# Patient Record
Sex: Female | Born: 1935 | Race: White | Hispanic: No | Smoking: Never smoker
Health system: Southern US, Community
[De-identification: ages and names within clinical notes are randomized; demographics above are authoritative.]

## PROBLEM LIST (undated history)

## (undated) DIAGNOSIS — E039 Hypothyroidism, unspecified: Secondary | ICD-10-CM

## (undated) DIAGNOSIS — M1990 Unspecified osteoarthritis, unspecified site: Secondary | ICD-10-CM

## (undated) DIAGNOSIS — F0390 Unspecified dementia without behavioral disturbance: Secondary | ICD-10-CM

## (undated) DIAGNOSIS — D649 Anemia, unspecified: Secondary | ICD-10-CM

## (undated) DIAGNOSIS — N390 Urinary tract infection, site not specified: Secondary | ICD-10-CM

## (undated) DIAGNOSIS — E785 Hyperlipidemia, unspecified: Secondary | ICD-10-CM

## (undated) DIAGNOSIS — I10 Essential (primary) hypertension: Secondary | ICD-10-CM

## (undated) DIAGNOSIS — N189 Chronic kidney disease, unspecified: Secondary | ICD-10-CM

## (undated) HISTORY — PX: FRACTURE SURGERY: SHX138

## (undated) HISTORY — PX: ROTATOR CUFF REPAIR: SHX139

## (undated) HISTORY — PX: LITHOTRIPSY: SUR834

## (undated) HISTORY — PX: CATARACT EXTRACTION: SUR2

## (undated) HISTORY — PX: NASAL SINUS SURGERY: SHX719

---

## 2012-01-23 ENCOUNTER — Emergency Department (HOSPITAL_COMMUNITY): Payer: Medicare Other

## 2012-01-23 ENCOUNTER — Ambulatory Visit (HOSPITAL_COMMUNITY): Payer: Medicare Other

## 2012-01-23 ENCOUNTER — Encounter (HOSPITAL_COMMUNITY): Payer: Self-pay | Admitting: *Deleted

## 2012-01-23 ENCOUNTER — Encounter (HOSPITAL_COMMUNITY): Payer: Self-pay | Admitting: Anesthesiology

## 2012-01-23 ENCOUNTER — Inpatient Hospital Stay (HOSPITAL_COMMUNITY)
Admission: EM | Admit: 2012-01-23 | Discharge: 2012-01-27 | DRG: 482 | Disposition: A | Discharge status: Skilled Nursing Facility | Payer: Medicare Other | Attending: Specialist | Admitting: Specialist

## 2012-01-23 ENCOUNTER — Encounter (HOSPITAL_COMMUNITY)
Admission: EM | Disposition: A | Discharge status: Skilled Nursing Facility | Payer: Self-pay | Source: Home / Self Care | Attending: Specialist

## 2012-01-23 ENCOUNTER — Ambulatory Visit (HOSPITAL_COMMUNITY): Payer: Medicare Other | Admitting: Anesthesiology

## 2012-01-23 DIAGNOSIS — E032 Hypothyroidism due to medicaments and other exogenous substances: Secondary | ICD-10-CM

## 2012-01-23 DIAGNOSIS — Y92009 Unspecified place in unspecified non-institutional (private) residence as the place of occurrence of the external cause: Secondary | ICD-10-CM

## 2012-01-23 DIAGNOSIS — F0390 Unspecified dementia without behavioral disturbance: Secondary | ICD-10-CM

## 2012-01-23 DIAGNOSIS — I10 Essential (primary) hypertension: Secondary | ICD-10-CM | POA: Diagnosis present

## 2012-01-23 DIAGNOSIS — F068 Other specified mental disorders due to known physiological condition: Secondary | ICD-10-CM | POA: Diagnosis present

## 2012-01-23 DIAGNOSIS — S7292XA Unspecified fracture of left femur, initial encounter for closed fracture: Secondary | ICD-10-CM

## 2012-01-23 DIAGNOSIS — S7222XA Displaced subtrochanteric fracture of left femur, initial encounter for closed fracture: Secondary | ICD-10-CM | POA: Diagnosis present

## 2012-01-23 DIAGNOSIS — W19XXXA Unspecified fall, initial encounter: Secondary | ICD-10-CM | POA: Diagnosis present

## 2012-01-23 DIAGNOSIS — E039 Hypothyroidism, unspecified: Secondary | ICD-10-CM | POA: Diagnosis present

## 2012-01-23 DIAGNOSIS — S72309A Unspecified fracture of shaft of unspecified femur, initial encounter for closed fracture: Secondary | ICD-10-CM

## 2012-01-23 DIAGNOSIS — E785 Hyperlipidemia, unspecified: Secondary | ICD-10-CM | POA: Diagnosis present

## 2012-01-23 DIAGNOSIS — S7223XA Displaced subtrochanteric fracture of unspecified femur, initial encounter for closed fracture: Principal | ICD-10-CM | POA: Diagnosis present

## 2012-01-23 HISTORY — DX: Chronic kidney disease, unspecified: N18.9

## 2012-01-23 HISTORY — DX: Unspecified osteoarthritis, unspecified site: M19.90

## 2012-01-23 HISTORY — PX: FEMUR IM NAIL: SHX1597

## 2012-01-23 HISTORY — DX: Anemia, unspecified: D64.9

## 2012-01-23 HISTORY — DX: Unspecified dementia, unspecified severity, without behavioral disturbance, psychotic disturbance, mood disturbance, and anxiety: F03.90

## 2012-01-23 HISTORY — DX: Hyperlipidemia, unspecified: E78.5

## 2012-01-23 HISTORY — DX: Essential (primary) hypertension: I10

## 2012-01-23 HISTORY — DX: Hypothyroidism, unspecified: E03.9

## 2012-01-23 LAB — COMPREHENSIVE METABOLIC PANEL
ALT: 17 U/L (ref 0–35)
AST: 27 U/L (ref 0–37)
Albumin: 3.7 g/dL (ref 3.5–5.2)
CO2: 23 mEq/L (ref 19–32)
Chloride: 99 mEq/L (ref 96–112)
Creatinine, Ser: 0.71 mg/dL (ref 0.50–1.10)
Potassium: 3.3 mEq/L — ABNORMAL LOW (ref 3.5–5.1)
Sodium: 136 mEq/L (ref 135–145)
Total Bilirubin: 0.9 mg/dL (ref 0.3–1.2)

## 2012-01-23 LAB — ABO/RH: ABO/RH(D): O POS

## 2012-01-23 LAB — CBC
MCV: 89.7 fL (ref 78.0–100.0)
Platelets: 250 10*3/uL (ref 150–400)
RBC: 4.95 MIL/uL (ref 3.87–5.11)
RDW: 12 % (ref 11.5–15.5)
WBC: 11.5 10*3/uL — ABNORMAL HIGH (ref 4.0–10.5)

## 2012-01-23 LAB — PROTIME-INR: INR: 1 (ref 0.00–1.49)

## 2012-01-23 LAB — SURGICAL PCR SCREEN: Staphylococcus aureus: NEGATIVE

## 2012-01-23 LAB — CK: Total CK: 597 U/L — ABNORMAL HIGH (ref 7–177)

## 2012-01-23 LAB — TYPE AND SCREEN
ABO/RH(D): O POS
Antibody Screen: NEGATIVE

## 2012-01-23 SURGERY — INSERTION, INTRAMEDULLARY ROD, FEMUR
Anesthesia: General | Site: Leg Upper | Laterality: Left | Wound class: Clean

## 2012-01-23 MED ORDER — ONDANSETRON HCL 4 MG PO TABS: 4.0000 mg | ORAL_TABLET | Freq: Four times a day (QID) | ORAL | Status: DC | PRN

## 2012-01-23 MED ORDER — PHENOL 1.4 % MT LIQD
1.0000 | OROMUCOSAL | Status: DC | PRN
  Filled 2012-01-23: qty 177

## 2012-01-23 MED ORDER — LACTATED RINGERS IV SOLN: INTRAVENOUS | Status: DC

## 2012-01-23 MED ORDER — METOCLOPRAMIDE HCL 5 MG/ML IJ SOLN: 5.0000 mg | Freq: Three times a day (TID) | INTRAMUSCULAR | Status: DC | PRN

## 2012-01-23 MED ORDER — FENTANYL CITRATE 0.05 MG/ML IJ SOLN: 25.0000 ug | INTRAMUSCULAR | Status: DC | PRN

## 2012-01-23 MED ORDER — PROMETHAZINE HCL 25 MG/ML IJ SOLN: 6.2500 mg | INTRAMUSCULAR | Status: DC | PRN

## 2012-01-23 MED ORDER — METHOCARBAMOL 100 MG/ML IJ SOLN
500.0000 mg | Freq: Four times a day (QID) | INTRAVENOUS | Status: DC | PRN
  Filled 2012-01-23: qty 5

## 2012-01-23 MED ORDER — BISACODYL 10 MG RE SUPP: 10.0000 mg | Freq: Every day | RECTAL | Status: DC | PRN

## 2012-01-23 MED ORDER — CEFAZOLIN SODIUM 1-5 GM-% IV SOLN
1.0000 g | INTRAVENOUS | Status: AC
  Administered 2012-01-23: 1 g via INTRAVENOUS

## 2012-01-23 MED ORDER — LEVOTHYROXINE SODIUM 125 MCG PO TABS
125.0000 ug | ORAL_TABLET | Freq: Every day | ORAL | Status: DC
  Administered 2012-01-24 – 2012-01-27 (×4): 125 ug via ORAL
  Filled 2012-01-23 (×4): qty 1

## 2012-01-23 MED ORDER — VERAPAMIL HCL ER 240 MG PO TBCR
240.0000 mg | EXTENDED_RELEASE_TABLET | Freq: Every day | ORAL | Status: DC
  Administered 2012-01-24 – 2012-01-27 (×4): 240 mg via ORAL
  Filled 2012-01-23 (×4): qty 1

## 2012-01-23 MED ORDER — PROPOFOL 10 MG/ML IV EMUL
INTRAVENOUS | Status: DC | PRN
  Administered 2012-01-23: 120 mg via INTRAVENOUS

## 2012-01-23 MED ORDER — HYDROCHLOROTHIAZIDE 12.5 MG PO CAPS
12.5000 mg | ORAL_CAPSULE | Freq: Every day | ORAL | Status: DC
  Administered 2012-01-24: 12.5 mg via ORAL
  Filled 2012-01-23: qty 1

## 2012-01-23 MED ORDER — POTASSIUM CHLORIDE IN NACL 20-0.9 MEQ/L-% IV SOLN
INTRAVENOUS | Status: AC
  Filled 2012-01-23: qty 1000

## 2012-01-23 MED ORDER — LIDOCAINE HCL (CARDIAC) 20 MG/ML IV SOLN
INTRAVENOUS | Status: DC | PRN
  Administered 2012-01-23: 70 mg via INTRAVENOUS

## 2012-01-23 MED ORDER — POTASSIUM CHLORIDE IN NACL 20-0.9 MEQ/L-% IV SOLN
INTRAVENOUS | Status: DC
  Administered 2012-01-23 – 2012-01-25 (×3): via INTRAVENOUS
  Filled 2012-01-23 (×5): qty 1000

## 2012-01-23 MED ORDER — FENTANYL CITRATE 0.05 MG/ML IJ SOLN
INTRAMUSCULAR | Status: DC | PRN
  Administered 2012-01-23: 100 ug via INTRAVENOUS

## 2012-01-23 MED ORDER — ACETAMINOPHEN 10 MG/ML IV SOLN
INTRAVENOUS | Status: DC | PRN
  Administered 2012-01-23: 1000 mg via INTRAVENOUS

## 2012-01-23 MED ORDER — SODIUM CHLORIDE 0.9 % IV SOLN
Freq: Once | INTRAVENOUS | Status: AC
  Administered 2012-01-23: 15:00:00 via INTRAVENOUS

## 2012-01-23 MED ORDER — CEFAZOLIN SODIUM 1-5 GM-% IV SOLN
1.0000 g | Freq: Four times a day (QID) | INTRAVENOUS | Status: AC
  Administered 2012-01-24 (×3): 1 g via INTRAVENOUS
  Filled 2012-01-23 (×3): qty 50

## 2012-01-23 MED ORDER — EPHEDRINE SULFATE 50 MG/ML IJ SOLN
INTRAMUSCULAR | Status: DC | PRN
  Administered 2012-01-23: 10 mg via INTRAVENOUS

## 2012-01-23 MED ORDER — POLYETHYLENE GLYCOL 3350 17 G PO PACK
17.0000 g | PACK | Freq: Every day | ORAL | Status: DC | PRN
  Administered 2012-01-27: 17 g via ORAL

## 2012-01-23 MED ORDER — CEFAZOLIN SODIUM 1-5 GM-% IV SOLN
INTRAVENOUS | Status: AC
  Filled 2012-01-23: qty 50

## 2012-01-23 MED ORDER — MORPHINE SULFATE 4 MG/ML IJ SOLN
2.0000 mg | Freq: Once | INTRAMUSCULAR | Status: AC
  Administered 2012-01-23: 2 mg via INTRAVENOUS
  Filled 2012-01-23: qty 1

## 2012-01-23 MED ORDER — FENTANYL CITRATE 0.05 MG/ML IJ SOLN
INTRAMUSCULAR | Status: AC
  Filled 2012-01-23: qty 2

## 2012-01-23 MED ORDER — FENTANYL CITRATE 0.05 MG/ML IJ SOLN
100.0000 ug | Freq: Once | INTRAMUSCULAR | Status: AC
  Administered 2012-01-23: 100 ug via INTRAVENOUS

## 2012-01-23 MED ORDER — HYDROCODONE-ACETAMINOPHEN 5-325 MG PO TABS: 1.0000 | ORAL_TABLET | ORAL | Status: DC | PRN

## 2012-01-23 MED ORDER — 0.9 % SODIUM CHLORIDE (POUR BTL) OPTIME
TOPICAL | Status: DC | PRN
  Administered 2012-01-23: 1000 mL

## 2012-01-23 MED ORDER — STERILE WATER FOR IRRIGATION IR SOLN
Status: DC | PRN
  Administered 2012-01-23: 1500 mL

## 2012-01-23 MED ORDER — LACTATED RINGERS IV SOLN
INTRAVENOUS | Status: DC | PRN
  Administered 2012-01-23: 19:00:00 via INTRAVENOUS

## 2012-01-23 MED ORDER — METHOCARBAMOL 500 MG PO TABS
500.0000 mg | ORAL_TABLET | Freq: Four times a day (QID) | ORAL | Status: DC | PRN
  Administered 2012-01-25 – 2012-01-27 (×3): 500 mg via ORAL
  Filled 2012-01-23 (×3): qty 1

## 2012-01-23 MED ORDER — ONDANSETRON HCL 4 MG/2ML IJ SOLN
INTRAMUSCULAR | Status: DC | PRN
  Administered 2012-01-23: 4 mg via INTRAVENOUS

## 2012-01-23 MED ORDER — MENTHOL 3 MG MT LOZG
1.0000 | LOZENGE | OROMUCOSAL | Status: DC | PRN
  Filled 2012-01-23: qty 9

## 2012-01-23 MED ORDER — PHENYLEPHRINE HCL 10 MG/ML IJ SOLN
INTRAMUSCULAR | Status: DC | PRN
  Administered 2012-01-23 (×2): 40 ug via INTRAVENOUS

## 2012-01-23 MED ORDER — CYANOCOBALAMIN 500 MCG PO TABS
500.0000 ug | ORAL_TABLET | Freq: Every day | ORAL | Status: DC
  Administered 2012-01-24 – 2012-01-27 (×4): 500 ug via ORAL
  Filled 2012-01-23 (×4): qty 1

## 2012-01-23 MED ORDER — DOCUSATE SODIUM 100 MG PO CAPS
100.0000 mg | ORAL_CAPSULE | Freq: Two times a day (BID) | ORAL | Status: DC
  Administered 2012-01-24 – 2012-01-27 (×7): 100 mg via ORAL

## 2012-01-23 MED ORDER — MUPIROCIN 2 % EX OINT
TOPICAL_OINTMENT | CUTANEOUS | Status: AC
  Filled 2012-01-23: qty 22

## 2012-01-23 MED ORDER — HYDROCODONE-ACETAMINOPHEN 5-325 MG PO TABS
1.0000 | ORAL_TABLET | ORAL | Status: DC | PRN
  Administered 2012-01-24: 2 via ORAL
  Administered 2012-01-27: 1 via ORAL
  Filled 2012-01-23: qty 1
  Filled 2012-01-23: qty 2
  Filled 2012-01-23: qty 1

## 2012-01-23 MED ORDER — ACETAMINOPHEN 650 MG RE SUPP: 650.0000 mg | Freq: Four times a day (QID) | RECTAL | Status: DC | PRN

## 2012-01-23 MED ORDER — SUCCINYLCHOLINE CHLORIDE 20 MG/ML IJ SOLN
INTRAMUSCULAR | Status: DC | PRN
  Administered 2012-01-23: 100 mg via INTRAVENOUS

## 2012-01-23 MED ORDER — PHENYLEPHRINE HCL 10 MG/ML IJ SOLN
10.0000 mg | INTRAVENOUS | Status: DC | PRN
  Administered 2012-01-23: 10 ug/min via INTRAVENOUS

## 2012-01-23 MED ORDER — ONDANSETRON HCL 4 MG/2ML IJ SOLN: 4.0000 mg | Freq: Four times a day (QID) | INTRAMUSCULAR | Status: DC | PRN

## 2012-01-23 MED ORDER — ACETAMINOPHEN 325 MG PO TABS
650.0000 mg | ORAL_TABLET | Freq: Four times a day (QID) | ORAL | Status: DC | PRN
  Administered 2012-01-24 – 2012-01-27 (×5): 650 mg via ORAL
  Filled 2012-01-23 (×6): qty 2

## 2012-01-23 MED ORDER — ACETAMINOPHEN 10 MG/ML IV SOLN
INTRAVENOUS | Status: AC
  Filled 2012-01-23: qty 100

## 2012-01-23 MED ORDER — MORPHINE SULFATE 2 MG/ML IJ SOLN: 0.5000 mg | INTRAMUSCULAR | Status: DC | PRN

## 2012-01-23 MED ORDER — METOCLOPRAMIDE HCL 10 MG PO TABS: 5.0000 mg | ORAL_TABLET | Freq: Three times a day (TID) | ORAL | Status: DC | PRN

## 2012-01-23 SURGICAL SUPPLY — 44 items
BAG ZIPLOCK 12X15 (MISCELLANEOUS) ×2 IMPLANT
BANDAGE GAUZE ELAST BULKY 4 IN (GAUZE/BANDAGES/DRESSINGS) ×2 IMPLANT
BIT DRILL 3.8X6 NS (BIT) ×2 IMPLANT
BIT DRILL 5.3 (BIT) ×2 IMPLANT
BNDG COHESIVE 6X5 TAN STRL LF (GAUZE/BANDAGES/DRESSINGS) ×2 IMPLANT
CLOTH BEACON ORANGE TIMEOUT ST (SAFETY) ×2 IMPLANT
COVER MAYO STAND STRL (DRAPES) ×2 IMPLANT
DRAPE C-ARM 42X72 X-RAY (DRAPES) IMPLANT
DRAPE STERI IOBAN 125X83 (DRAPES) ×2 IMPLANT
DRSG MEPILEX BORDER 4X4 (GAUZE/BANDAGES/DRESSINGS) ×2 IMPLANT
DRSG MEPILEX BORDER 4X8 (GAUZE/BANDAGES/DRESSINGS) ×2 IMPLANT
DRSG PAD ABDOMINAL 8X10 ST (GAUZE/BANDAGES/DRESSINGS) ×6 IMPLANT
DURAPREP 26ML APPLICATOR (WOUND CARE) ×2 IMPLANT
ELECT REM PT RETURN 9FT ADLT (ELECTROSURGICAL) ×2
ELECTRODE REM PT RTRN 9FT ADLT (ELECTROSURGICAL) ×1 IMPLANT
GLOVE BIO SURGEON STRL SZ 6.5 (GLOVE) ×2 IMPLANT
GLOVE BIOGEL PI IND STRL 8 (GLOVE) ×1 IMPLANT
GLOVE BIOGEL PI INDICATOR 8 (GLOVE) ×1
GLOVE ECLIPSE 6.5 STRL STRAW (GLOVE) IMPLANT
GLOVE INDICATOR 6.5 STRL GRN (GLOVE) ×2 IMPLANT
GLOVE SURG SS PI 8.0 STRL IVOR (GLOVE) IMPLANT
GOWN PREVENTION PLUS LG XLONG (DISPOSABLE) IMPLANT
GOWN PREVENTION PLUS XLARGE (GOWN DISPOSABLE) IMPLANT
GOWN STRL REIN XL XLG (GOWN DISPOSABLE) ×4 IMPLANT
GUIDEPIN 3.2X17.5 THRD DISP (PIN) ×2 IMPLANT
GUIDEWIRE BALL NOSE 100CM (WIRE) ×2 IMPLANT
KIT BASIN OR (CUSTOM PROCEDURE TRAY) ×2 IMPLANT
MANIFOLD NEPTUNE II (INSTRUMENTS) ×2 IMPLANT
NAIL FEMORAL 11X36CM (Nail) ×2 IMPLANT
PACK GENERAL/GYN (CUSTOM PROCEDURE TRAY) ×2 IMPLANT
PAD CAST 4YDX4 CTTN HI CHSV (CAST SUPPLIES) ×1 IMPLANT
PADDING CAST COTTON 4X4 STRL (CAST SUPPLIES) ×1
SCREW ACE CORTICAL (Screw) ×1 IMPLANT
SCREW ACE CORTICAL 6.5X70MML (Screw) IMPLANT
SCREW ACECAP 40MM (Screw) ×2 IMPLANT
SCREW BN FT 80X6.5XST DRV (Screw) ×1 IMPLANT
SCREWDRIVER HEX TIP 3.5MM (MISCELLANEOUS) ×2 IMPLANT
SPONGE GAUZE 4X4 12PLY (GAUZE/BANDAGES/DRESSINGS) ×2 IMPLANT
STAPLER VISISTAT (STAPLE) ×2 IMPLANT
SUT VIC AB 0 CT1 27 (SUTURE) ×2
SUT VIC AB 0 CT1 27XBRD ANTBC (SUTURE) ×2 IMPLANT
SUT VIC AB 2-0 CT1 27 (SUTURE) ×1
SUT VIC AB 2-0 CT1 TAPERPNT 27 (SUTURE) ×1 IMPLANT
TRAY FOLEY CATH 14FRSI W/METER (CATHETERS) ×2 IMPLANT

## 2012-01-23 NOTE — ED Provider Notes (Signed)
History     CSN: [id number]  Arrival date & time 01/23/12  1403   None     Chief Complaint  Patient presents with  . Fall    HPI The patient presents following a fall.  The patient has dementia and is incapable of providing any information regarding the history of present illness.  Per report the patient was found on the floor next to her bed with a deformity on the left lower extremity.  She is typically ambulatory.  Per the patient's grand daughter, the patient has no other notable medical problems.  However, the patient's full medical history is difficult to ascertain.  Level V Caveat - Dementia  No past medical history on file.  No past surgical history on file.  No family history on file.  History  Substance Use Topics  . Smoking status: Not on file  . Smokeless tobacco: Not on file  . Alcohol Use: Not on file    OB History    No data available      Review of Systems  Unable to perform ROS: Dementia    Allergies  Review of patient's allergies indicates not on file.  Home Medications  No current outpatient prescriptions on file.  BP 150/81  Pulse 93  Temp(Src) 97.4 F (36.3 C) (Oral)  Resp 22  SpO2 97%  Physical Exam  Nursing note and vitals reviewed. Constitutional: Vital signs are normal. She appears listless. She appears cachectic. Cervical collar and backboard in place.    HENT:  Head: Head is without raccoon's eyes, without Battle's sign and without right periorbital erythema. Hair is normal.  Mouth/Throat: Mucous membranes are normal.  Eyes: Conjunctivae are normal.  Neck: Neck supple. No tracheal tenderness and no spinous process tenderness present. Erythema present. Normal range of motion present. No thyromegaly present.       Patient's C-Collar removed.  She is moving the neck freely and has no appreciable deformity, nor any ttp.  Cardiovascular: Regular rhythm and intact distal pulses.   Murmur heard. Pulmonary/Chest: Stridor present. No  respiratory distress. She has no wheezes.  Abdominal: She exhibits no distension.  Genitourinary: Vagina normal.  Musculoskeletal:       The pelvis is stable, though just inferior to the pelvis there is a notable deformity of the proximal thigh.  The patient's knee is aligned, and she moves it minimally, spontaneously.  The ankle has appropriate pulses and the patient moves it minimally as well.  Neurological: She appears listless.       The patient does not interact with neurologic exam.  She tracks visually, spontaneously but not consistently with command.  Moves all extremities spontaneously.  Skin: Skin is intact. No abrasion and no rash noted.       ED Course  Procedures (including critical care time)   Labs Reviewed  CBC  COMPREHENSIVE METABOLIC PANEL  PROTIME-INR  CK  URINALYSIS, ROUTINE W REFLEX MICROSCOPIC   No results found.   No diagnosis found.    MDM  This elderly female with dementia presents following a fall with new left proximal thigh deformity consistent with femur fracture.  This was demonstrated on x-ray.  The remainder of her films were largely reassuring.  I discussed the case with Dr. Shelle Iron, and he requested that the patient be transferred to [facility] due to OR availability.    Gerhard Munch, MD 01/23/12 [phone]

## 2012-01-23 NOTE — Transfer of Care (Signed)
Immediate Anesthesia Transfer of Care Note  Patient: Anne Gonzales  Procedure(s) Performed: Procedure(s) (LRB): INTRAMEDULLARY (IM) NAIL FEMORAL (Left)  Patient Location: PACU  Anesthesia Type: General  Level of Consciousness: sedated and patient cooperative  Airway & Oxygen Therapy: Patient Spontanous Breathing and Patient connected to face mask oxygen  Post-op Assessment: Report given to PACU RN and Post -op Vital signs reviewed and stable  Post vital signs: Reviewed and stable  Complications: No apparent anesthesia complications

## 2012-01-23 NOTE — Anesthesia Preprocedure Evaluation (Addendum)
Anesthesia Evaluation  Patient identified by MRN, date of birth, ID band Patient awake and Patient confused  General Assessment Comment:Granddaughter providing medical history.   Reviewed: Allergy & Precautions, H&P , NPO status , Patient's Chart, lab work & pertinent test results  History of Anesthesia Complications Negative for: history of anesthetic complications  Airway Mallampati: II TM Distance: >3 FB Neck ROM: Full    Dental  (+) Edentulous Upper and Edentulous Lower   Pulmonary neg pulmonary ROS,  breath sounds clear to auscultation  Pulmonary exam normal       Cardiovascular hypertension, Pt. on medications negative cardio ROS  Rhythm:Regular Rate:Normal     Neuro/Psych Dementia negative neurological ROS  negative psych ROS   GI/Hepatic negative GI ROS, Neg liver ROS,   Endo/Other  negative endocrine ROSHypothyroidism   Renal/GU Renal InsufficiencyRenal diseasenegative Renal ROS  negative genitourinary   Musculoskeletal negative musculoskeletal ROS (+)   Abdominal   Peds  Hematology negative hematology ROS (+)   Anesthesia Other Findings   Reproductive/Obstetrics negative OB ROS                           Anesthesia Physical Anesthesia Plan  ASA: III and Emergent  Anesthesia Plan: General   Post-op Pain Management:    Induction: Intravenous  Airway Management Planned: Oral ETT  Additional Equipment:   Intra-op Plan:   Post-operative Plan: Extubation in OR  Informed Consent: I have reviewed the patients History and Physical, chart, labs and discussed the procedure including the risks, benefits and alternatives for the proposed anesthesia with the patient or authorized representative who has indicated his/her understanding and acceptance.   Dental advisory given  Plan Discussed with: CRNA  Anesthesia Plan Comments:         Anesthesia Quick Evaluation

## 2012-01-23 NOTE — ED Notes (Signed)
Patient was rolled off long spineboard while maintaining C-Spine Dr. Jeraldine Loots at bedside. Firm support to left arm during movement. Good pedal pulse post board removal.

## 2012-01-23 NOTE — Brief Op Note (Signed)
01/23/2012  9:28 PM  PATIENT:  Anne Gonzales  78 y.o. female  PRE-OPERATIVE DIAGNOSIS:  fractured left hip  POST-OPERATIVE DIAGNOSIS:  fractured left hip  PROCEDURE:  Procedure(s) (LRB): INTRAMEDULLARY (IM) NAIL FEMORAL (Left)  SURGEON:  Surgeon(s) and Role:    * Javier Docker, MD - Primary  PHYSICIAN ASSISTANT:   ASSISTANTS: strader   ANESTHESIA:   general  EBL:  Total I/O In: -  Out: 200 [Urine:200]  BLOOD ADMINISTERED:none  DRAINS: none   LOCAL MEDICATIONS USED:  NONE  SPECIMEN:  No Specimen  DISPOSITION OF SPECIMEN:  N/A  COUNTS:  YES  TOURNIQUET:  * No tourniquets in log *  DICTATION: .Other Dictation: Dictation Number [id number]  PLAN OF CARE: Admit to inpatient   PATIENT DISPOSITION:  PACU - hemodynamically stable.   Delay start of Pharmacological VTE agent (>24hrs) due to surgical blood loss or risk of bleeding: no

## 2012-01-23 NOTE — Preoperative (Signed)
Beta Blockers   Reason not to administer Beta Blockers:Not Applicable 

## 2012-01-23 NOTE — Anesthesia Postprocedure Evaluation (Signed)
Anesthesia Post Note  Patient: Anne Gonzales  Procedure(s) Performed: Procedure(s) (LRB): INTRAMEDULLARY (IM) NAIL FEMORAL (Left)  Anesthesia type: General  Patient location: PACU  Post pain: Pain level controlled  Post assessment: Post-op Vital signs reviewed  Last Vitals:  Filed Vitals:   01/23/12 2230  BP:   Pulse:   Temp: 36.4 C  Resp:     Post vital signs: Reviewed  Level of consciousness: sedated  Complications: No apparent anesthesia complications

## 2012-01-23 NOTE — H&P (Signed)
Chief Complaint:  left hip pain.  Subjective: Patient is admitted for left IM nail of the femur  Patient is a 78 y.o. female who sustained a fall today at home.  Her caregiver was found her laying beside the shower.  She was brought via EMS to [facility] ED where X-rays showed displaced left femur fracture.  Per ER MD the patient was stable and did not complain of any other injuries.  After review of X-rays Dr Shelle Iron felt the patient needed IM nail of the femur for stabilization.  No OR time was available at [facility] so the patient was transferred via carelink to [facility] for surgical intervention.  We did consult [facility] for medical clearance and management of postoperative issues. Risks and benefits of the surgery were discussed with the grand daughter.  Allergies: No Known Allergies   Medications: Prescriptions prior to admission  Medication Sig Dispense Refill  . aspirin 325 MG EC tablet Take 325 mg by mouth daily.      . hydrochlorothiazide (MICROZIDE) 12.5 MG capsule Take 12.5 mg by mouth daily.      Marland Kitchen levothyroxine (SYNTHROID, LEVOTHROID) 125 MCG tablet Take 125 mcg by mouth daily.      . verapamil (CALAN-SR) 240 MG CR tablet Take 240 mg by mouth daily.      . vitamin B-12 (CYANOCOBALAMIN) 500 MCG tablet Take 500 mcg by mouth daily.      . Vitamin D, Ergocalciferol, (DRISDOL) 50000 UNITS CAPS Take 50,000 Units by mouth every 7 (seven) days.        Past Medical History: Past Medical History  Diagnosis Date  . Dementia   Hypertension Hypercholesteremia Hypothyroidism   Past Surgical History: No past surgical history on file.   Family History: No family history on file.  Social History: History  Substance Use Topics  . Smoking status: Not on file  . Smokeless tobacco: Not on file  . Alcohol Use:      Review of Systems Review of systems not obtained due to patient factors.  Physical Exam:  BP 150/81  Pulse 93  Temp(Src) 97.4 F (36.3 C) (Oral)  Resp 22  SpO2 97        General appearance: no distress, slowed mentation and confused as to time and place, hard of hearing Lungs: clear to auscultation bilaterally Heart: regular rate and rhythm, S1, S2 normal, no murmur, click, rub or gallop Extremities: shortened and externally rotated left leg, sensation is intact, good capillary refill, compartments are soft  Assessment/Plan: Left femur fracture The patient is being admitted to [facility] to undergo a left femur IM nailing.  Surgery will be performed by Dr. Jene Every.  Risks and benefits have been discussed with the patient and they elect to proceed wth the procedure.

## 2012-01-23 NOTE — ED Notes (Addendum)
Pt was found on floor beside her bedside commode at nursing home. Unwitnessed fall. Correction: pt lives at home, not at nursing facility. Pt has caregiver who visits her in the home.

## 2012-01-23 NOTE — ED Notes (Signed)
Patient arrived to ed via [facility] states caretaker found her lying beside. Her bed unsure how long patient had been lying there. EMS states [facility] was bedside of her bed as if she was trying to get on the [facility]. Upon arrival to ed alert non- verbal per ems normal for her baseline. Angulation to left femur, positive left pedal pulse. Bruising over def  Site, no open area. Bruising to left hip and left flank area.

## 2012-01-23 NOTE — Consult Note (Signed)
Requesting physician: Orthopaedics Dr. Shelle Iron  Primary Care Physician: Dr. Aida Puffer  Reason for consultation: Medical clearance and medical management of Known medical conditions   History of Present Illness: Patient is an 78 y/o CF that presents after a recent unwitnesed fall.  Reportedly patient stays at a home where her daughter mainly takes care of her but when the family is away there is a care giver that helps.  Well caregiver recently found the patient laying on the floor and patient was taken to the ED for further evaluation.  In the ED patient x ray of the proximal left thigh was obtained secondary to proximal thigh deformity after fall.  X ray showed femur fracture of the upper femur.    Patient has dementia and is unable to provide history.  History is obtained from chart and grand daughter.  Reportedly patient does not have any or recent history of arrythmia's, heart attacks, or lung disease.  She has hypertension which she takes HCTZ and verapimil for.  Also has hypothyroidism.  But otherwise there is no other reported medical conditions.  Allergies:  No Known Allergies    Past Medical History  Diagnosis Date  . Dementia   . Hypertension   . Arthritis   . Hypothyroidism   . Vitamin d deficiency   . Hyperlipemia   . Anemia     b12 def  . Chronic kidney disease     kidney stones    Past Surgical History  Procedure Date  . Fracture surgery     R Leg  . Rotator cuff repair     Left  . Lithotripsy   . Cataract extraction     Scheduled Meds:   . sodium chloride   Intravenous Once  .  ceFAZolin (ANCEF) IV  1 g Intravenous On Call  . fentaNYL  100 mcg Intravenous Once  . morphine  2 mg Intravenous Once  . mupirocin ointment       Continuous Infusions:  PRN Meds:.HYDROcodone-acetaminophen  Social History:  does not have a smoking history on file. She does not have any smokeless tobacco history on file. She reports that she does not drink alcohol or use  illicit drugs.  History reviewed. No pertinent family history.  Review of Systems:  Unable to properly assess due to dementia   Physical Exam: Blood pressure 153/72, pulse 97, temperature 97.6 F (36.4 C), temperature source Oral, resp. rate 16, SpO2 98.00%. General: Alert, awake, in no acute distress. HEENT: No bruits, no goiter. Heart: Regular rate and rhythm, without murmurs, rubs, gallops. Lungs: Clear to auscultation bilaterally. Abdomen: Soft, nontender, nondistended, positive bowel sounds. Extremities: Proximal left thigh deformity with positive pedal pulses BL. Neuro: hard of hearing, sensation intact at lower extremities to light touch  Labs on Admission:  Results for orders placed during the hospital encounter of 01/23/12 (from the past 48 hour(s))  CBC     Status: Abnormal   Collection Time   01/23/12  3:22 PM      Component Value Range Comment   WBC 11.5 (*) 4.0 - 10.5 (K/uL)    RBC 4.95  3.87 - 5.11 (MIL/uL)    Hemoglobin 16.0 (*) 12.0 - 15.0 (g/dL)    HCT 16.1  09.6 - 04.5 (%)    MCV 89.7  78.0 - 100.0 (fL)    MCH 32.3  26.0 - 34.0 (pg)    MCHC 36.0  30.0 - 36.0 (g/dL)    RDW 40.9  81.1 - 91.4 (%)  Platelets 250  150 - 400 (K/uL)   COMPREHENSIVE METABOLIC PANEL     Status: Abnormal   Collection Time   01/23/12  3:22 PM      Component Value Range Comment   Sodium 136  135 - 145 (mEq/L)    Potassium 3.3 (*) 3.5 - 5.1 (mEq/L)    Chloride 99  96 - 112 (mEq/L)    CO2 23  19 - 32 (mEq/L)    Glucose, Bld 195 (*) 70 - 99 (mg/dL)    BUN 22  6 - 23 (mg/dL)    Creatinine, Ser 2.13  0.50 - 1.10 (mg/dL)    Calcium 9.3  8.4 - 10.5 (mg/dL)    Total Protein 7.1  6.0 - 8.3 (g/dL)    Albumin 3.7  3.5 - 5.2 (g/dL)    AST 27  0 - 37 (U/L)    ALT 17  0 - 35 (U/L)    Alkaline Phosphatase 97  39 - 117 (U/L)    Total Bilirubin 0.9  0.3 - 1.2 (mg/dL)    GFR calc non Af Amer 76 (*) >90 (mL/min)    GFR calc Af Amer 88 (*) >90 (mL/min)   PROTIME-INR     Status: Normal    Collection Time   01/23/12  3:22 PM      Component Value Range Comment   Prothrombin Time 13.4  11.6 - 15.2 (seconds)    INR 1.00  0.00 - 1.49    CK     Status: Abnormal   Collection Time   01/23/12  3:22 PM      Component Value Range Comment   Total CK 597 (*) 7 - 177 (U/L)     Radiological Exams on Admission: Dg Pelvis Portable  01/23/2012  *RADIOLOGY REPORT*  Clinical Data: Fall with pelvic pain.  PORTABLE PELVIS  Comparison: None  Findings: A fracture of the left femur is noted on the edge of the film. There is no evidence of subluxation or dislocation. An intramedullary rod within the right femur is noted. Mild degenerative changes in both hips are present.  IMPRESSION: Left femur fracture on the edge of the film, not well characterized.  No other acute bony abnormalities identified.  Original Report Authenticated By: Rosendo Gros, M.D.   Dg Chest Port 1 View  01/23/2012  *RADIOLOGY REPORT*  Clinical Data: Fall with left femur fracture. Preoperative respiratory examination.  PORTABLE CHEST - 1 VIEW  Comparison: None  Findings: Upper limits normal heart size noted. Mild peribronchial thickening is likely chronic. There is no evidence of focal airspace disease, pulmonary edema, suspicious pulmonary nodule/mass, pleural effusion, or pneumothorax. No acute bony abnormalities are identified. Right shoulder hemiarthroplasty is identified.  IMPRESSION: No evidence of acute cardiopulmonary disease.  Upper limits normal heart size and mild peribronchial thickening.  Original Report Authenticated By: Rosendo Gros, M.D.   Dg Femur Left Port  01/23/2012  *RADIOLOGY REPORT*  Clinical Data: Fall, pain  PORTABLE LEFT FEMUR - 2 VIEW  Comparison: None.  Findings: There is a transverse fracture of the upper femur located at the junction of the proximal and middle thirds, with marked angulation and overriding fracture fragments. There is an obvious deformity of the leg.  IMPRESSION: Positive for femur  fracture.  Original Report Authenticated By: Elsie Stain, M.D.    Assessment/Plan 1) Closed left femur fracture:   -Ortho managing.  Will follow with their recommendations.  Patient to go to OR today.  2) HTN:  Will plan on continuing home regimen tomorrow  3) Hypothyroidism:  As mentioned above would continue home regimen tomorrow after procedure.  At this point patient is cleared with low-intermediate risk given her age.  But since patient does not have any significant cardiac or pulmonary disease history suspect patient is more low risk.  Will follow up with ortho's recommendations.  Will continue to follow   Time Spent on Consultation: 30 minutes.  Penny Pia [facility]  [phone] 01/23/2012, 6:02 PM

## 2012-01-24 ENCOUNTER — Encounter (HOSPITAL_COMMUNITY): Payer: Self-pay | Admitting: *Deleted

## 2012-01-24 DIAGNOSIS — I10 Essential (primary) hypertension: Secondary | ICD-10-CM

## 2012-01-24 DIAGNOSIS — E032 Hypothyroidism due to medicaments and other exogenous substances: Secondary | ICD-10-CM

## 2012-01-24 DIAGNOSIS — F0390 Unspecified dementia without behavioral disturbance: Secondary | ICD-10-CM

## 2012-01-24 DIAGNOSIS — S72309A Unspecified fracture of shaft of unspecified femur, initial encounter for closed fracture: Secondary | ICD-10-CM

## 2012-01-24 LAB — CBC
MCH: 31.6 pg (ref 26.0–34.0)
Platelets: 206 10*3/uL (ref 150–400)
RBC: 3.99 MIL/uL (ref 3.87–5.11)
WBC: 7.7 10*3/uL (ref 4.0–10.5)

## 2012-01-24 LAB — BASIC METABOLIC PANEL
CO2: 25 mEq/L (ref 19–32)
Calcium: 8.7 mg/dL (ref 8.4–10.5)
Chloride: 101 mEq/L (ref 96–112)
Potassium: 4 mEq/L (ref 3.5–5.1)
Sodium: 137 mEq/L (ref 135–145)

## 2012-01-24 MED ORDER — SODIUM CHLORIDE 0.9 % IV BOLUS (SEPSIS)
500.0000 mL | Freq: Once | INTRAVENOUS | Status: AC
  Administered 2012-01-24: 500 mL via INTRAVENOUS

## 2012-01-24 MED ORDER — ASPIRIN 325 MG PO TABS
325.0000 mg | ORAL_TABLET | Freq: Two times a day (BID) | ORAL | Status: DC
  Administered 2012-01-24 – 2012-01-27 (×7): 325 mg via ORAL
  Filled 2012-01-24 (×9): qty 1

## 2012-01-24 NOTE — Evaluation (Signed)
Physical Therapy Evaluation Patient Details Name: Anne Gonzales MRN: [id number] DOB: [birth year] Today's Date: 01/24/2012 Time: 4098-1191 PT Time Calculation (min): 29 min  PT Assessment / Plan / Recommendation Clinical Impression  Pt presents s/p IM nail from sustaining femur fracture s/p fall.  Tolerated some steps from bed to chair, however steps are difficult due to decreased cognition and pt being PWB (25%).  Pt will benefit from skilled PT in acute care to address deficits.  PT recommends SNF for follow up therapy to increase pt safety and decrease BOC.      PT Assessment  Patient needs continued PT services    Follow Up Recommendations  Skilled nursing facility    Barriers to Discharge Decreased caregiver support      lEquipment Recommendations  Defer to next venue    Recommendations for Other Services OT consult   Frequency Min 3X/week    Precautions / Restrictions Precautions Precautions: Fall Restrictions Weight Bearing Restrictions: Yes LLE Weight Bearing: Partial weight bearing LLE Partial Weight Bearing Percentage or Pounds: 25%   Pertinent Vitals/Pain Unable to state pain due to decreased cognition.       Mobility  Bed Mobility Bed Mobility: Supine to Sit;Sitting - Scoot to Edge of Bed Supine to Sit: 1: +2 Total assist Supine to Sit: Patient Percentage: 40% Sitting - Scoot to Edge of Bed: 1: +2 Total assist Sitting - Scoot to Edge of Bed: Patient Percentage: 20% Details for Bed Mobility Assistance: Assist for LLE to EOB and off of bed with some assist for trunk, however noted pt to have good trunk strength and able to attain sitting almost on her own.  Cues for hand placement to self assist trunk.  Transfers Transfers: Sit to Stand;Stand to Sit Sit to Stand: 1: +2 Total assist Sit to Stand: Patient Percentage: 30% Stand to Sit: 1: +2 Total assist Stand to Sit: Patient Percentage: 30% Details for Transfer Assistance: Requires assist for rising to standing  position with cues for hand placement and PWB status.   Ambulation/Gait Ambulation/Gait Assistance: 1: +2 Total assist Ambulation/Gait: Patient Percentage: 30% Ambulation Distance (Feet): 5 Feet Assistive device: Rolling walker Ambulation/Gait Assistance Details: Pt took some steps from bed to chair with +2 assist to ensure PWB status with verbal and manual cuing for advancing LLE, cues for sequencing/technique and upright posture.  Gait Pattern: Step-to pattern;Decreased stance time - left;Decreased step length - right Gait velocity: decreased Stairs: No Wheelchair Mobility Wheelchair Mobility: No    Exercises     PT Diagnosis: Difficulty walking;Abnormality of gait;Generalized weakness;Acute pain  PT Problem List: Decreased strength;Decreased range of motion;Decreased activity tolerance;Decreased balance;Decreased mobility;Decreased coordination;Decreased knowledge of use of DME;Decreased cognition;Pain;Decreased knowledge of precautions PT Treatment Interventions: DME instruction;Gait training;Functional mobility training;Therapeutic activities;Therapeutic exercise;Balance training;Patient/family education   PT Goals Acute Rehab PT Goals PT Goal Formulation: With patient Time For Goal Achievement: 01/31/12 Potential to Achieve Goals: Fair Pt will go Supine/Side to Sit: with min assist PT Goal: Supine/Side to Sit - Progress: Goal set today Pt will go Sit to Supine/Side: with min assist PT Goal: Sit to Supine/Side - Progress: Goal set today Pt will go Sit to Stand: with mod assist PT Goal: Sit to Stand - Progress: Goal set today Pt will go Stand to Sit: with mod assist PT Goal: Stand to Sit - Progress: Goal set today Pt will Transfer Bed to Chair/Chair to Bed: with mod assist PT Transfer Goal: Bed to Chair/Chair to Bed - Progress: Goal set today Pt will Ambulate: 16 -  50 feet;with mod assist;with least restrictive assistive device PT Goal: Ambulate - Progress: Goal set  today  Visit Information  Last PT Received On: 01/24/12 Assistance Needed: +2    Subjective Data  Subjective: My leg is broke Patient Stated Goal: Unable to state goals due to decreased cognition.    Prior Functioning  Home Living Lives With: Alone (daughter assists intermittently) Available Help at Discharge: Family;Available PRN/intermittently Type of Home: House Home Access: Ramped entrance Home Layout: One level Bathroom Shower/Tub: Tub/shower unit Home Adaptive Equipment: Bedside commode/3-in-1;Walker - rolling Prior Function Level of Independence: Needs assistance Needs Assistance: Bathing;Dressing Bath: Minimal Dressing: Minimal Driving: No Vocation: Retired Musician: Clinical cytogeneticist  Overall Cognitive Status: No family/caregiver present to determine baseline cognitive functioning Arousal/Alertness: Awake/alert Orientation Level: Disoriented to;Place;Time;Situation (somewhat oriented to situation) Behavior During Session: [facility] for tasks performed Cognition - Other Comments: Family friend present who states she does have cognitive impairments.  Per notes, pt with history of dementia..     Extremity/Trunk Assessment Right Lower Extremity Assessment RLE ROM/Strength/Tone: WFL for tasks assessed RLE Sensation: WFL - Light Touch RLE Coordination: WFL - gross motor Left Lower Extremity Assessment LLE ROM/Strength/Tone: Unable to fully assess;Due to pain;Due to precautions;Due to impaired cognition LLE Sensation: WFL - Light Touch LLE Coordination: WFL - gross motor   Balance Balance Balance Assessed: Yes Static Sitting Balance Static Sitting - Balance Support: Feet supported;Bilateral upper extremity supported Static Sitting - Level of Assistance: 5: Stand by assistance Static Sitting - Comment/# of Minutes: Pt able to sit EOB with B UE support x 3-4 mins at stand by assist.   End of Session PT - End of Session Equipment Utilized During  Treatment: Gait belt Activity Tolerance: Patient limited by pain Patient left: in chair;with call bell/phone within reach;with family/visitor present Nurse Communication: Mobility status   Page, Meribeth Mattes 01/24/2012, 11:54 AM

## 2012-01-24 NOTE — Progress Notes (Signed)
Clinical Social Work Department BRIEF PSYCHOSOCIAL ASSESSMENT 01/24/2012  Patient:  Anne Gonzales, Anne Gonzales     Account Number:  [id number]     Admit date:  01/23/2012  Clinical Social Worker:  Candie Chroman  Date/Time:  01/24/2012 03:45 AM  Referred by:  Physician  Date Referred:  01/24/2012 Referred for  SNF Placement   Other Referral:   Interview type:  Family Other interview type:    PSYCHOSOCIAL DATA Living Status:  ALONE Admitted from facility:   Level of care:   Primary support name:  Kalman Jewels Primary support relationship to patient: Adult Child Degree of support available:   limited    CURRENT CONCERNS Current Concerns  Post-Acute Placement   Other Concerns:    SOCIAL WORK ASSESSMENT / PLAN CSW consulted for SNF placement. Met with pt/family to assist with d/c planning. Pt lives alone, dx with dementia. Family provides support. Family is agreeable with SNF placement. [employer] contacted and clinicals provided for prior auth. CSW wil follow to assist with d/c planning .   Assessment/plan status:  Psychosocial Support/Ongoing Assessment of Needs Other assessment/ plan:   Information/referral to community resources:    PATIENT'S/FAMILY'S RESPONSE TO PLAN OF CARE: Family is interested in SNF placement.      Cori Razor  LCSW  [phone]

## 2012-01-24 NOTE — Progress Notes (Signed)
Subjective: Patient has no new complaints.  Mentions that her leg discomfort is tolerable.  No acute issues overnight reported.  Objective: Filed Vitals:   01/24/12 0330 01/24/12 0630 01/24/12 1000 01/24/12 1400  BP: 117/70 101/61 103/63 89/52  Pulse: 91 92 102 93  Temp: 98.1 F (36.7 C) 99.1 F (37.3 C) 97.7 F (36.5 C) 98.6 F (37 C)  TempSrc: Oral Oral Oral Oral  Resp: 16 16 16 16   Height:      Weight:      SpO2: 100% 99% 99% 95%   Weight change:   Intake/Output Summary (Last 24 hours) at 01/24/12 1539 Last data filed at 01/24/12 1410  Gross per 24 hour  Intake   1240 ml  Output    750 ml  Net    490 ml    General: Alert, awake, oriented x3, in no acute distress.  HEENT: No bruits, no goiter.  Heart: Regular rate and rhythm, without murmurs, rubs, gallops.  Lungs: Clear to auscultation, bilateral air movement.  Abdomen: Soft, nontender, nondistended, positive bowel sounds.  Neuro: Hard of hearing but answers questions appropriately Extremity:  Dressing in place no active bleeding over left leg or cellulitis.    Lab Results:  Basename 01/24/12 0400 01/23/12 1522  NA 137 136  K 4.0 3.3*  CL 101 99  CO2 25 23  GLUCOSE 139* 195*  BUN 24* 22  CREATININE 0.89 0.71  CALCIUM 8.7 9.3  MG -- --  PHOS -- --    Basename 01/23/12 1522  AST 27  ALT 17  ALKPHOS 97  BILITOT 0.9  PROT 7.1  ALBUMIN 3.7   No results found for this basename: LIPASE:2,AMYLASE:2 in the last 72 hours  Basename 01/24/12 0400 01/23/12 1522  WBC 7.7 11.5*  NEUTROABS -- --  HGB 12.6 16.0*  HCT 36.5 44.4  MCV 91.5 89.7  PLT 206 250    Basename 01/23/12 1522  CKTOTAL 597*  CKMB --  CKMBINDEX --  TROPONINI --   No components found with this basename: POCBNP:3 No results found for this basename: DDIMER:2 in the last 72 hours No results found for this basename: HGBA1C:2 in the last 72 hours No results found for this basename: CHOL:2,HDL:2,LDLCALC:2,TRIG:2,CHOLHDL:2,LDLDIRECT:2 in  the last 72 hours No results found for this basename: TSH,T4TOTAL,FREET3,T3FREE,THYROIDAB in the last 72 hours No results found for this basename: VITAMINB12:2,FOLATE:2,FERRITIN:2,TIBC:2,IRON:2,RETICCTPCT:2 in the last 72 hours  Micro Results: Recent Results (from the past 240 hour(s))  SURGICAL PCR SCREEN     Status: Normal   Collection Time   01/23/12  5:04 PM      Component Value Range Status Comment   MRSA, PCR NEGATIVE  NEGATIVE  Final    Staphylococcus aureus NEGATIVE  NEGATIVE  Final     Studies/Results: Dg Femur Left  01/24/2012  *RADIOLOGY REPORT*  Clinical Data: Operative reduction and internal fixation of the left femur.  Left proximal femur fracture.  LEFT FEMUR - 2 VIEW OBTAINED ON SIX IMAGES  Comparison: 01/23/2012  Findings: Antegrade intramedullary femoral rod noted.  Interlocking intertrochanteric cortical screw is observed.  The femoral rod traverses the transverse fracture; on the lateral projection there is 8 mm of displacement without overlap or angulation.  The distal transverse interlocking screw is present in the distal femoral diaphysis.  IMPRESSION:  1.  Operative reduction and internal fixation of the left transverse femoral fracture, with markedly improved alignment and without complicating feature observed.  Original Report Authenticated By: Dellia Cloud, M.D.   Dg  Pelvis Portable  01/23/2012  *RADIOLOGY REPORT*  Clinical Data: Fall with pelvic pain.  PORTABLE PELVIS  Comparison: None  Findings: A fracture of the left femur is noted on the edge of the film. There is no evidence of subluxation or dislocation. An intramedullary rod within the right femur is noted. Mild degenerative changes in both hips are present.  IMPRESSION: Left femur fracture on the edge of the film, not well characterized.  No other acute bony abnormalities identified.  Original Report Authenticated By: Rosendo Gros, M.D.   Dg Chest Port 1 View  01/23/2012  *RADIOLOGY REPORT*  Clinical  Data: Fall with left femur fracture. Preoperative respiratory examination.  PORTABLE CHEST - 1 VIEW  Comparison: None  Findings: Upper limits normal heart size noted. Mild peribronchial thickening is likely chronic. There is no evidence of focal airspace disease, pulmonary edema, suspicious pulmonary nodule/mass, pleural effusion, or pneumothorax. No acute bony abnormalities are identified. Right shoulder hemiarthroplasty is identified.  IMPRESSION: No evidence of acute cardiopulmonary disease.  Upper limits normal heart size and mild peribronchial thickening.  Original Report Authenticated By: Rosendo Gros, M.D.   Dg Hip Portable 1 View Left  01/24/2012  *RADIOLOGY REPORT*  Clinical Data: Operative reduction and internal fixation of proximal femoral fracture.  PORTABLE LEFT HIP - 1 VIEW  Comparison: Fluoroscopic spot images from 01/23/2012 at 9:22 p.m.  Findings: The frontal radiograph of the left hip and proximal femur demonstrates antegrade left femoral intramedullary rod with interlocking intertrochanteric cortical screw.  The rod traverses the transverse fracture, with near anatomic alignment on the frontal projection.  IMPRESSION:  1.  ORIF of left femoral fracture, without complicating feature observed.  Original Report Authenticated By: Dellia Cloud, M.D.   Dg Femur Left Port  01/23/2012  *RADIOLOGY REPORT*  Clinical Data: Fall, pain  PORTABLE LEFT FEMUR - 2 VIEW  Comparison: None.  Findings: There is a transverse fracture of the upper femur located at the junction of the proximal and middle thirds, with marked angulation and overriding fracture fragments. There is an obvious deformity of the leg.  IMPRESSION: Positive for femur fracture.  Original Report Authenticated By: Elsie Stain, M.D.   Dg C-arm 61-120 Min-no Report  01/23/2012  CLINICAL DATA: surgery   C-ARM 61-120 MINUTES  Fluoroscopy was utilized by the requesting physician.  No radiographic  interpretation.      Medications:  I have reviewed the patient's current medications.   Patient Active Hospital Problem List: Femur fracture, left (01/23/2012) At this point Primary team is managing.    Hypothyroidism (01/23/2012) Will plan on continuing current regimen.  Currently stable.  HTN (hypertension) (01/23/2012) Last recorded blood pressure 89/52 but it has ranged to 167/71.  At this point will hold patient's hctz and continue to monitor blood pressure.  Will fluid bolus patient with 500 cc fluid bolus and recheck blood pressure afterwards.   Closed left subtrochanteric femur fracture (01/23/2012) Per Orthopaedic team.  Disposition:  Per primary team.  Will continue to follow.     LOS: 1 day   Penny Pia M.D.  [facility] 01/24/2012, 3:39 PM

## 2012-01-24 NOTE — Progress Notes (Signed)
Utilization review completed.  

## 2012-01-24 NOTE — Progress Notes (Signed)
Subjective: 1 Day Post-Op Procedure(s) (LRB): INTRAMEDULLARY (IM) NAIL FEMORAL (Left) Patient reports pain as mild.  She has no other complaints Denies CP or SOB.   Objective: Vital signs in last 24 hours: Temp:  [97 F (36.1 C)-99.1 F (37.3 C)] 99.1 F (37.3 C) (05/17 0630) Pulse Rate:  [83-97] 92  (05/17 0630) Resp:  [12-22] 16  (05/17 0630) BP: (96-167)/(51-81) 101/61 mmHg (05/17 0630) SpO2:  [90 %-100 %] 99 % (05/17 0630) Weight:  [68.675 kg (151 lb 6.4 oz)] 68.675 kg (151 lb 6.4 oz) (05/16 2300)  Intake/Output from previous day: 05/16 0701 - 05/17 0700 In: 1000 [I.V.:1000] Out: 400 [Urine:200; Blood:200] Intake/Output this shift:     Basename 01/24/12 0400 01/23/12 1522  HGB 12.6 16.0*    Basename 01/24/12 0400 01/23/12 1522  WBC 7.7 11.5*  RBC 3.99 4.95  HCT 36.5 44.4  PLT 206 250    Basename 01/24/12 0400 01/23/12 1522  NA 137 136  K 4.0 3.3*  CL 101 99  CO2 25 23  BUN 24* 22  CREATININE 0.89 0.71  GLUCOSE 139* 195*  CALCIUM 8.7 9.3    Basename 01/23/12 1522  LABPT --  INR 1.00    Neurologically intact Neurovascular intact Sensation intact distally Dorsiflexion/Plantar flexion intact Incision: scant drainage she does have ecchymosis at proximal and mid shaft of the left femur Compartment soft  Assessment/Plan: 1 Day Post-Op Procedure(s) (LRB): INTRAMEDULLARY (IM) NAIL FEMORAL (Left) Keep foley Dressing change tomorrow PT Will KVO IV when taking pos well IS Q1 hour ASA 325mg  starting today  Lalitha Ilyas R. 01/24/2012, 9:27 AM

## 2012-01-24 NOTE — Progress Notes (Signed)
Physical Therapy Treatment Patient Details Name: Anne Gonzales MRN: [id number] DOB: 09/15/1922 Today's Date: 01/24/2012 Time: 1440-1501 PT Time Calculation (min): 21 min  PT Assessment / Plan / Recommendation Comments on Treatment Session  Pt with no increased c/o pain during transfer, however due to decreased cognition requires increased assist when performing all mobiltiy.     Follow Up Recommendations  Skilled nursing facility    Barriers to Discharge        Equipment Recommendations  Defer to next venue    Recommendations for Other Services OT consult  Frequency Min 3X/week   Plan Discharge plan remains appropriate    Precautions / Restrictions Precautions Precautions: Fall Restrictions Weight Bearing Restrictions: Yes LLE Weight Bearing: Partial weight bearing LLE Partial Weight Bearing Percentage or Pounds: 25%   Pertinent Vitals/Pain Unable to state pain due to decreased cognition.     Mobility  Bed Mobility Bed Mobility: Sit to Supine Sit to Supine: 1: +2 Total assist Sit to Supine: Patient Percentage: 20% Details for Bed Mobility Assistance: Assist for LLE into bed with assist for controlled descent of trunk.  Cues for sequencing/technique, however pt unable to fully follow commands.  Transfers Transfers: Sit to Stand;Stand to Sit Sit to Stand: 1: +2 Total assist Sit to Stand: Patient Percentage: 30% Stand to Sit: 1: +2 Total assist Stand to Sit: Patient Percentage: 30% Details for Transfer Assistance: Requires assist for rising to standing position with cues for hand placement and PWB status.  Ambulation/Gait Ambulation/Gait Assistance: 1: +2 Total assist Ambulation/Gait: Patient Percentage: 30% Ambulation Distance (Feet): 5 Feet Assistive device: Rolling walker Ambulation/Gait Assistance Details: Continues to require assist for weight shifting and advancing B LE.  Cues for upright posture and to maintain PWB status.  Gait Pattern: Step-to pattern;Decreased  stance time - left;Decreased step length - right Gait velocity: decreased    Exercises     PT Diagnosis:    PT Problem List:   PT Treatment Interventions:     PT Goals Acute Rehab PT Goals PT Goal Formulation: With patient Time For Goal Achievement: 01/31/12 Potential to Achieve Goals: Fair Pt will go Sit to Supine/Side: with min assist PT Goal: Sit to Supine/Side - Progress: Progressing toward goal Pt will go Sit to Stand: with mod assist PT Goal: Sit to Stand - Progress: Progressing toward goal Pt will go Stand to Sit: with mod assist PT Goal: Stand to Sit - Progress: Progressing toward goal Pt will Transfer Bed to Chair/Chair to Bed: with mod assist PT Transfer Goal: Bed to Chair/Chair to Bed - Progress: Progressing toward goal  Visit Information  Last PT Received On: 01/24/12 Assistance Needed: +2    Subjective Data  Subjective: Pt with very little verbalization during session.  Patient Stated Goal: Unable to state goals due to decreased cognition.    Cognition  Overall Cognitive Status: No family/caregiver present to determine baseline cognitive functioning Arousal/Alertness: Awake/alert Orientation Level: Disoriented to;Place;Time;Situation Behavior During Session: WFL for tasks performed Cognition - Other Comments: Family friend present who states she does have cognitive impairments.  Per notes, pt with history of dementia..     Balance     End of Session PT - End of Session Activity Tolerance: Patient tolerated treatment well Patient left: in bed;with call bell/phone within reach Nurse Communication: Mobility status    Page, Meribeth Mattes 01/24/2012, 4:47 PM

## 2012-01-24 NOTE — Progress Notes (Addendum)
Clinical Social Work Department CLINICAL SOCIAL WORK PLACEMENT NOTE 01/24/2012  Patient:  Anne Gonzales, Anne Gonzales  Account Number:  [id number] Admit date:  01/23/2012  Clinical Social Worker:  Cori Razor, LCSW  Date/time:  01/24/2012 03:59 AM  Clinical Social Work is seeking post-discharge placement for this patient at the following level of care:   SKILLED NURSING   (*CSW will update this form in Epic as items are completed)   01/24/2012  Patient/family provided with [facility] Department of Clinical Social Work's list of facilities offering this level of care within the geographic area requested by the patient (or if unable, by the patient's family).  01/24/2012  Patient/family informed of their freedom to choose among providers that offer the needed level of care, that participate in Medicare, Medicaid or managed care program needed by the patient, have an available bed and are willing to accept the patient.    Patient/family informed of [facility]' ownership interest in [facility], as well as of the fact that they are under no obligation to receive care at this facility.  PASARR submitted to [employer] on 01/26/2012 PASARR number received from [employer] on   FL2 transmitted to all facilities in geographic area requested by pt/family on  01/24/2012 FL2 transmitted to all facilities within larger geographic area on   Patient informed that his/her managed care company has contracts with or will negotiate with  certain facilities, including the following:     Patient/family informed of bed offers received:   Patient chooses bed at  Physician recommends and patient chooses bed at    Patient to be transferred to  on   Patient to be transferred to facility by   The following physician request were entered in Epic:   Additional Comments:  Cori Razor  LCSW  [phone]

## 2012-01-25 DIAGNOSIS — E032 Hypothyroidism due to medicaments and other exogenous substances: Secondary | ICD-10-CM

## 2012-01-25 DIAGNOSIS — S72309A Unspecified fracture of shaft of unspecified femur, initial encounter for closed fracture: Secondary | ICD-10-CM

## 2012-01-25 DIAGNOSIS — F0390 Unspecified dementia without behavioral disturbance: Secondary | ICD-10-CM

## 2012-01-25 DIAGNOSIS — I10 Essential (primary) hypertension: Secondary | ICD-10-CM

## 2012-01-25 LAB — BASIC METABOLIC PANEL
BUN: 29 mg/dL — ABNORMAL HIGH (ref 6–23)
CO2: 23 mEq/L (ref 19–32)
Calcium: 8.4 mg/dL (ref 8.4–10.5)
Creatinine, Ser: 0.86 mg/dL (ref 0.50–1.10)
Glucose, Bld: 113 mg/dL — ABNORMAL HIGH (ref 70–99)
Sodium: 137 mEq/L (ref 135–145)

## 2012-01-25 LAB — CBC
MCV: 92.9 fL (ref 78.0–100.0)
Platelets: 182 10*3/uL (ref 150–400)
RBC: 3.39 MIL/uL — ABNORMAL LOW (ref 3.87–5.11)
WBC: 6.9 10*3/uL (ref 4.0–10.5)

## 2012-01-25 NOTE — Progress Notes (Signed)
Subjective: Patient feels much better is eating and smiling.  Reportedly has had her pain controlled with tylenol.  No acute issues overnight.  Objective: Filed Vitals:   01/25/12 0800 01/25/12 1200 01/25/12 1357 01/25/12 1600  BP:   115/60   Pulse:   76   Temp:   98.1 F (36.7 C)   TempSrc:   Oral   Resp: 18 16 16 16   Height:      Weight:      SpO2:   95%    Weight change:   Intake/Output Summary (Last 24 hours) at 01/25/12 1743 Last data filed at 01/25/12 1718  Gross per 24 hour  Intake 4153.75 ml  Output    800 ml  Net 3353.75 ml    General: Alert, awake, oriented x3, in no acute distress.  HEENT: No bruits, no goiter.  Heart: Regular rate and rhythm, without murmurs, rubs, gallops.  Lungs: Clear to auscultation, bilateral air movement.  Abdomen: Soft, nontender, nondistended, positive bowel sounds.  Neuro: Hard of hearing , answers questions appropriately   Lab Results:  Basename 01/25/12 0355 01/24/12 0400  NA 137 137  K 3.7 4.0  CL 106 101  CO2 23 25  GLUCOSE 113* 139*  BUN 29* 24*  CREATININE 0.86 0.89  CALCIUM 8.4 8.7  MG -- --  PHOS -- --    Basename 01/23/12 1522  AST 27  ALT 17  ALKPHOS 97  BILITOT 0.9  PROT 7.1  ALBUMIN 3.7   No results found for this basename: LIPASE:2,AMYLASE:2 in the last 72 hours  Basename 01/25/12 0355 01/24/12 0400  WBC 6.9 7.7  NEUTROABS -- --  HGB 10.6* 12.6  HCT 31.5* 36.5  MCV 92.9 91.5  PLT 182 206    Basename 01/23/12 1522  CKTOTAL 597*  CKMB --  CKMBINDEX --  TROPONINI --   No components found with this basename: POCBNP:3 No results found for this basename: DDIMER:2 in the last 72 hours No results found for this basename: HGBA1C:2 in the last 72 hours No results found for this basename: CHOL:2,HDL:2,LDLCALC:2,TRIG:2,CHOLHDL:2,LDLDIRECT:2 in the last 72 hours No results found for this basename: TSH,T4TOTAL,FREET3,T3FREE,THYROIDAB in the last 72 hours No results found for this basename:  VITAMINB12:2,FOLATE:2,FERRITIN:2,TIBC:2,IRON:2,RETICCTPCT:2 in the last 72 hours  Micro Results: Recent Results (from the past 240 hour(s))  SURGICAL PCR SCREEN     Status: Normal   Collection Time   01/23/12  5:04 PM      Component Value Range Status Comment   MRSA, PCR NEGATIVE  NEGATIVE  Final    Staphylococcus aureus NEGATIVE  NEGATIVE  Final     Studies/Results: Dg Femur Left  01/24/2012  *RADIOLOGY REPORT*  Clinical Data: Operative reduction and internal fixation of the left femur.  Left proximal femur fracture.  LEFT FEMUR - 2 VIEW OBTAINED ON SIX IMAGES  Comparison: 01/23/2012  Findings: Antegrade intramedullary femoral rod noted.  Interlocking intertrochanteric cortical screw is observed.  The femoral rod traverses the transverse fracture; on the lateral projection there is 8 mm of displacement without overlap or angulation.  The distal transverse interlocking screw is present in the distal femoral diaphysis.  IMPRESSION:  1.  Operative reduction and internal fixation of the left transverse femoral fracture, with markedly improved alignment and without complicating feature observed.  Original Report Authenticated By: Dellia Cloud, M.D.   Dg Hip Portable 1 View Left  01/24/2012  *RADIOLOGY REPORT*  Clinical Data: Operative reduction and internal fixation of proximal femoral fracture.  PORTABLE LEFT  HIP - 1 VIEW  Comparison: Fluoroscopic spot images from 01/23/2012 at 9:22 p.m.  Findings: The frontal radiograph of the left hip and proximal femur demonstrates antegrade left femoral intramedullary rod with interlocking intertrochanteric cortical screw.  The rod traverses the transverse fracture, with near anatomic alignment on the frontal projection.  IMPRESSION:  1.  ORIF of left femoral fracture, without complicating feature observed.  Original Report Authenticated By: Dellia Cloud, M.D.   Dg C-arm 61-120 Min-no Report  01/23/2012  CLINICAL DATA: surgery   C-ARM 61-120  MINUTES  Fluoroscopy was utilized by the requesting physician.  No radiographic  interpretation.      Medications: I have reviewed the patient's current medications.   Patient Active Hospital Problem List: Femur fracture, left/Closed left subtrochanteric femur fracture  (01/23/2012) - Per primary team  Hypothyroidism (01/23/2012) - Stable on home regimen.  Would plan on continuing same regimen as outpatient.  HTN (hypertension) (01/23/2012) - Controlled off of hctz.  Would plan on discharging her off of her hctz as outpatient. - Continue verapimil  Disposition:  Per primary at this point.  Will plan on signing off but will be available for questions. Thank you for this consult.     LOS: 2 days   Penny Pia M.D. Pager [phone]  [facility] 01/25/2012, 5:43 PM

## 2012-01-25 NOTE — Progress Notes (Signed)
Anne Gonzales  MRN: [id number] DOB/Age: 1923-03-29 78 y.o. Physician: Jacquelyne Balint Procedure: Procedure(s) (LRB): INTRAMEDULLARY (IM) NAIL FEMORAL (Left)     Subjective: Just ate full lunch. C/o mild soreness.   Vital Signs Temp:  [97.9 F (36.6 C)-98.9 F (37.2 C)] 97.9 F (36.6 C) (05/18 0543) Pulse Rate:  [85-93] 88  (05/18 0543) Resp:  [16] 16  (05/18 0543) BP: (89-143)/(52-73) 131/73 mmHg (05/18 0543) SpO2:  [95 %-96 %] 95 % (05/18 0543)  Lab Results  Basename 01/25/12 0355 01/24/12 0400  WBC 6.9 7.7  HGB 10.6* 12.6  HCT 31.5* 36.5  PLT 182 206   BMET  Basename 01/25/12 0355 01/24/12 0400  NA 137 137  K 3.7 4.0  CL 106 101  CO2 23 25  GLUCOSE 113* 139*  BUN 29* 24*  CREATININE 0.86 0.89  CALCIUM 8.4 8.7   INR  Date Value Range Status  01/23/2012 1.00  0.00-1.49 (no units) Final     Exam Left hip with moderate bruising but no active bleeding. Thigh and calf soft. Moves toes on command         Plan S/p IM nail L femur- cont PT/OT Dressing change Dc foley in am [name] for Dr.Kevin Supple 01/25/2012, 1:09 PM

## 2012-01-25 NOTE — Progress Notes (Signed)
Physical Therapy Treatment Patient Details Name: Anne Gonzales MRN: [id number] DOB: 1923/03/04 Today's Date: 01/25/2012 Time: 8295-6213 PT Time Calculation (min): 14 min  PT Assessment / Plan / Recommendation Comments on Treatment Session  pt pleasant and genernerally cooperative,but is limited by pain.  she is unable to move feet for transfer or progress to gait today    Follow Up Recommendations  Skilled nursing facility    Barriers to Discharge        Equipment Recommendations  Defer to next venue    Recommendations for Other Services    Frequency Min 3X/week   Plan Discharge plan remains appropriate    Precautions / Restrictions Precautions Precautions: Fall Restrictions Weight Bearing Restrictions: Yes LLE Weight Bearing: Partial weight bearing LLE Partial Weight Bearing Percentage or Pounds: 25%   Pertinent Vitals/Pain Pain increases with mobility, but is decreased at rest    Mobility  Bed Mobility Supine to Sit: 1: +2 Total assist Supine to Sit: Patient Percentage: 40% Sitting - Scoot to Edge of Bed: 1: +2 Total assist Sitting - Scoot to Edge of Bed: Patient Percentage: 50% Details for Bed Mobility Assistance: Assist for LLE into bed with assist for controlled descent of trunk.  Cues for sequencing/technique, however pt unable to fully follow commands.  Transfers Transfers: Sit to Stand;Stand to Sit Sit to Stand: 1: +2 Total assist Sit to Stand: Patient Percentage: 30% Stand to Sit: 1: +2 Total assist Stand to Sit: Patient Percentage: 30% Details for Transfer Assistance: assist to stand.  once in standing, needs max assist at hips to pivot  lower body Ambulation/Gait Ambulation/Gait Assistance: Not tested (comment);Other (comment) (pt uanble to move feet, did practice standing) Assistive device: Rolling walker Ambulation/Gait Assistance Details: pt is able to stand once assisted into upright position, she is unable to shift weight and move feet to pivot Stairs:  No Wheelchair Mobility Wheelchair Mobility: No    Exercises General Exercises - Lower Extremity Ankle Circles/Pumps: AAROM;Both;5 reps;Supine Short Arc Quad: Both;AAROM;5 reps;Supine   PT Diagnosis:    PT Problem List:   PT Treatment Interventions:     PT Goals Acute Rehab PT Goals PT Goal: Supine/Side to Sit - Progress: Progressing toward goal PT Goal: Sit to Stand - Progress: Progressing toward goal PT Goal: Stand to Sit - Progress: Progressing toward goal PT Transfer Goal: Bed to Chair/Chair to Bed - Progress: Progressing toward goal  Visit Information  Last PT Received On: 01/25/12 Assistance Needed: +2    Subjective Data  Subjective: My leg hurts Patient Stated Goal: Unable to state goals due to decreased cognition.    Cognition  Overall Cognitive Status: No family/caregiver present to determine baseline cognitive functioning Arousal/Alertness: Awake/alert Orientation Level: Disoriented to;Place;Time;Situation Behavior During Session: WFL for tasks performed Cognition - Other Comments: Pt pleasantly confused during eval    Balance  Balance Balance Assessed: Yes Static Sitting Balance Static Sitting - Balance Support: Feet supported;Bilateral upper extremity supported Static Sitting - Level of Assistance: 5: Stand by assistance Static Sitting - Comment/# of Minutes: 5 Static Standing Balance Static Standing - Balance Support: Bilateral upper extremity supported (on RW) Static Standing - Level of Assistance: 4: Min assist Static Standing - Comment/# of Minutes: 1  End of Session PT - End of Session Equipment Utilized During Treatment: Gait belt Activity Tolerance: Patient limited by pain Patient left: in chair;with call bell/phone within reach Nurse Communication: Need for lift equipment (use STEDY for return to bed)    Donnetta Hail 01/25/2012, 10:10 AM

## 2012-01-25 NOTE — Op Note (Signed)
Anne Gonzales, ALKINS NO.:  [id number]  MEDICAL RECORD NO.:  [id number]  LOCATION:  1602                         FACILITY:  [facility]  PHYSICIAN:  Jene Every, M.D.    DATE OF BIRTH:  05/11/1923  DATE OF PROCEDURE:  01/23/2012 DATE OF DISCHARGE:                              OPERATIVE REPORT   PREOPERATIVE DIAGNOSIS:  Femur fracture, left.  POSTOPERATIVE DIAGNOSIS:  Femur fracture, left.  PROCEDURE PERFORMED:  Intramedullary rodding, left femur, utilizing VersaNail, 11 mm diameter, 36 mm length.  HISTORY:  88, femur fracture requiring intramedullary rodding.  Risks and benefits were discussed including bleeding and infection.  TECHNIQUE:  With the patient in supine position, after induction of adequate general anesthesia, 1 g Kefzol placed on fracture table.  Left lower extremity, longitudinal traction, Foley to gravity.  Perineal post well-padded.  Well leg in gentle external rotation and flexion.  Hip, peritrochanteric region and leg was prepped and draped in usual sterile fashion.  Under x-ray, an incision proximal to the trochanter in line with the superior iliac spine.  Subcutaneous tissue was dissected. Electrocautery was utilized to achieve hemostasis.  The fascia lata was identified and divided in line of skin incision.  The tip of the trochanter identified and drilled into the femoral canal.  The AP and lateral plane were overreamed proximally.  Advanced a guideline and reduced the fracture.  Advanced a guidewire to the distal femoral physis.  Satisfactory AP and lateral plane reamed to 12.5 mm in diameter, and measured to a 36 in length from the distal physis to the tip of the trochanter, and inserted an 11 x 36 rod without difficulty to the tip of the trochanter.  We then used an external alignment guide and placed a proximal transfixing pin to the lesser to the greater trochanter, using a jig, drilling up and inserting an 18 mm screw  with satisfactory purchase.  Then distally, traction was unreleased.  Leg in neutral position, placed a distal femoral locking screw in the proximal static lock after using the radiolucent guide, making a small incision laterally.  Blunt dissection to the femur, drilling and inserting the 38 mm screw, bicortical excellent purchase in the AP and lateral plane. The fracture was reduced in the AP and lateral plane.  No femoral neck fracture was noted, excellent fixation.  The wound was copiously irrigated.  Closed the fascia with #1 Vicryl interrupted figure-of-eight sutures, subcu with 2-0 Vicryl simple sutures.  Skin was reapproximated with staples.  Wound was dressed sterilely.  Removed from the fracture table, leg length equivalent, pulses intact.  Sterile dressing had been applied.  Extubated without difficulty, and transported to the recovery room in satisfactory condition.  The patient tolerated the procedure well.  No complications.  ASSISTANT:  Roma Schanz, P.A.  ESTIMATED BLOOD LOSS:  150 cc.     Jene Every, M.D.     Cordelia Pen  D:  01/23/2012  T:  01/24/2012  Job:  [id number]

## 2012-01-25 NOTE — Evaluation (Signed)
Occupational Therapy Evaluation Patient Details Name: Anne Gonzales MRN: [id number] DOB: 1923/07/11 Today's Date: 01/25/2012 Time: 4098-1191 OT Time Calculation (min): 13 min  OT Assessment / Plan / Recommendation Clinical Impression  Pt presents s/p IM nail from sustaining femur fracture after a fall. Pt presents with generalized weakness, pain, decreased awareness of deficits and decreased awareness of precautions limiting independence with ADLs. Will benefit from skilled OT in the acute setting to maximize I with ADL and ADL mobility prior to d/c     OT Assessment  Patient needs continued OT Services    Follow Up Recommendations  Skilled nursing facility    Barriers to Discharge      Equipment Recommendations  Defer to next venue    Recommendations for Other Services    Frequency  Min 1X/week    Precautions / Restrictions Precautions Precautions: Fall Restrictions Weight Bearing Restrictions: Yes LLE Weight Bearing: Partial weight bearing LLE Partial Weight Bearing Percentage or Pounds: 25%   Pertinent Vitals/Pain Pt indicated LLE pain, but did not rate. RN made aware    ADL  Eating/Feeding: Simulated;Set up;Supervision/safety Where Assessed - Eating/Feeding: Chair Grooming: Performed;Wash/dry face;Set up Where Assessed - Grooming: Supported sitting Upper Body Bathing: Simulated;Minimal assistance Where Assessed - Upper Body Bathing: Supported sitting Lower Body Bathing: Simulated;Maximal assistance Where Assessed - Lower Body Bathing: Unsupported sitting;Supported standing Upper Body Dressing: Simulated;Minimal assistance Where Assessed - Upper Body Dressing: Unsupported sitting Lower Body Dressing: Simulated;+1 Total assistance Where Assessed - Lower Body Dressing: Unsupported sitting;Supported standing Transfers/Ambulation Related to ADLs: Did not attempt ambulation as pt admantly refusing sit to stand from chair secondary to pain. Per PT, pt is a +2 assist for  transfers ADL Comments: Pt pleasantly confused    OT Diagnosis: Generalized weakness;Acute pain  OT Problem List: Decreased strength;Decreased activity tolerance;Impaired balance (sitting and/or standing);Decreased safety awareness;Decreased knowledge of use of DME or AE;Decreased knowledge of precautions;Pain OT Treatment Interventions: Self-care/ADL training;DME and/or AE instruction;Therapeutic activities;Patient/family education;Balance training   OT Goals Acute Rehab OT Goals OT Goal Formulation: With patient Time For Goal Achievement: 02/08/12 Potential to Achieve Goals: Good ADL Goals Pt Will Perform Grooming: with min assist;Supported;Standing at sink ADL Goal: Grooming - Progress: Goal set today Pt Will Perform Lower Body Bathing: with min assist;Sit to stand from chair ADL Goal: Lower Body Bathing - Progress: Goal set today Pt Will Perform Upper Body Dressing: Sitting, chair;with set-up;with supervision;Sitting, bed ADL Goal: Upper Body Dressing - Progress: Goal set today Pt Will Transfer to Toilet: with mod assist;Ambulation;with DME;3-in-1 ADL Goal: Toilet Transfer - Progress: Goal set today Pt Will Perform Toileting - Clothing Manipulation: with min assist;Standing ADL Goal: Toileting - Clothing Manipulation - Progress: Goal set today Additional ADL Goal #1: Pt will maintain PWB status (25%) on LLE with Min VC's ADL Goal: Additional Goal #1 - Progress: Goal set today  Visit Information  Last OT Received On: 01/25/12 Assistance Needed: +2    Subjective Data  Subjective: Oh no, I'm still working Patient Stated Goal: None stated   Prior Functioning  Home Living Lives With: Alone Available Help at Discharge: Family;Available PRN/intermittently Type of Home: House Home Access: Ramped entrance Home Layout: One level Bathroom Shower/Tub: Tub/shower unit Home Adaptive Equipment: Bedside commode/3-in-1;Walker - rolling Prior Function Level of Independence: Needs  assistance Needs Assistance: Bathing;Dressing Bath: Minimal Dressing: Minimal Driving: No Vocation: Retired Musician: [employer] Dominant Hand: Right    Cognition  Overall Cognitive Status: No family/caregiver present to determine baseline cognitive functioning Arousal/Alertness: Awake/alert Orientation Level:  Disoriented to;Place;Time;Situation Behavior During Session: WFL for tasks performed Cognition - Other Comments: Pt pleasantly confused during eval    Extremity/Trunk Assessment Right Upper Extremity Assessment RUE ROM/Strength/Tone: Deficits RUE ROM/Strength/Tone Deficits: grossly 4=/5 RUE Sensation: WFL - Light Touch RUE Coordination: WFL - gross/fine motor Left Upper Extremity Assessment LUE ROM/Strength/Tone: Deficits LUE ROM/Strength/Tone Deficits: 4+/5 LUE Sensation: WFL - Light Touch LUE Coordination: WFL - gross/fine motor      Exercise General Exercises - Lower Extremity Ankle Circles/Pumps: AAROM;Both;5 reps;Supine Short Arc Quad: Both;AAROM;5 reps;Supine  Balance Balance Balance Assessed: Yes Static Sitting Balance Static Sitting - Balance Support: Feet supported;Bilateral upper extremity supported Static Sitting - Level of Assistance: 5: Stand by assistance Static Sitting - Comment/# of Minutes: 5  End of Session OT - End of Session Activity Tolerance: Patient limited by pain Patient left: in chair;with call bell/phone within reach;with nursing in room   Page Lancon 01/25/2012, 10:09 AM

## 2012-01-26 ENCOUNTER — Encounter (HOSPITAL_COMMUNITY): Payer: Self-pay | Admitting: *Deleted

## 2012-01-26 LAB — BASIC METABOLIC PANEL
CO2: 24 mEq/L (ref 19–32)
Calcium: 8.5 mg/dL (ref 8.4–10.5)
Creatinine, Ser: 0.8 mg/dL (ref 0.50–1.10)
GFR calc non Af Amer: 65 mL/min — ABNORMAL LOW (ref >90)

## 2012-01-26 LAB — CBC
MCH: 31.8 pg (ref 26.0–34.0)
MCHC: 34.1 g/dL (ref 30.0–36.0)
MCV: 93.3 fL (ref 78.0–100.0)
Platelets: 171 10*3/uL (ref 150–400)
RBC: 3.27 MIL/uL — ABNORMAL LOW (ref 3.87–5.11)

## 2012-01-26 NOTE — Progress Notes (Signed)
CSW left message for Pt's grand daughter regarding bed choice.   CSW weekday to F/U.   Anne Gonzales, LCSWA [employer] [phone]

## 2012-01-26 NOTE — Progress Notes (Signed)
Subjective: Procedure(s) (LRB): INTRAMEDULLARY (IM) NAIL FEMORAL (Left) 3 Days Post-Op  Patient reports pain as mild.  Reports incisional pain and pain when she moves about in bed   Negative bowel movement Positive flatus Negative chest pain or shortness of breath  Objective: Vital signs in last 24 hours: Temp:  [98 F (36.7 C)-98.3 F (36.8 C)] 98 F (36.7 C) (05/19 0550) Pulse Rate:  [76-88] 88  (05/19 0550) Resp:  [15-16] 15  (05/19 0550) BP: (115-145)/(60-77) 145/77 mmHg (05/19 0550) SpO2:  [95 %-97 %] 97 % (05/19 0550)  Intake/Output from previous day: 05/18 0701 - 05/19 0700 In: 2609.6 [P.O.:800; I.V.:1809.6] Out: 1500 [Urine:1500]   Basename 01/26/12 0523 01/25/12 0355  WBC 7.6 6.9  RBC 3.27* 3.39*  HCT 30.5* 31.5*  PLT 171 182    Basename 01/26/12 0523 01/25/12 0355  NA 137 137  K 3.9 3.7  CL 105 106  CO2 24 23  BUN 23 29*  CREATININE 0.80 0.86  GLUCOSE 115* 113*  CALCIUM 8.5 8.4    Basename 01/23/12 1522  LABPT --  INR 1.00    ABD soft Sensation intact distally Intact pulses distally Incision: dressing C/D/I Compartment soft hip with moderate bruising Moves toes on command    Assessment/Plan: Patient stable s/p IM nail left Continue mobilization with physical therapy per protocol Continue care  Discharge to SNF likely tomorrow  Gwinda Maine 01/26/2012, 8:15 AM

## 2012-01-26 NOTE — Progress Notes (Signed)
Physical Therapy Treatment Patient Details Name: Anne Gonzales MRN: [id number] DOB: July 11, 1923 Today's Date: 01/26/2012 Time: 1191-4782 PT Time Calculation (min): 20 min  PT Assessment / Plan / Recommendation Comments on Treatment Session  pt is tolerating well as far as mobility to recliner. pt does indicate that L hip hurts,    Follow Up Recommendations  Skilled nursing facility    Barriers to Discharge        Equipment Recommendations  Defer to next venue    Recommendations for Other Services    Frequency Min 3X/week   Plan Discharge plan remains appropriate;Frequency remains appropriate    Precautions / Restrictions Restrictions Weight Bearing Restrictions: Yes LLE Weight Bearing: Partial weight bearing LLE Partial Weight Bearing Percentage or Pounds: 25%   Pertinent Vitals/Pain Pt states hip hurts "when I move it".    Mobility  Bed Mobility Bed Mobility: Supine to Sit Supine to Sit: 1: +2 Total assist Supine to Sit: Patient Percentage: 40% Sitting - Scoot to Edge of Bed: 1: +2 Total assist Sitting - Scoot to Edge of Bed: Patient Percentage: 50% Transfers Transfers: Sit to Stand;Stand to [employer] Transfers Sit to Stand: 1: +2 Total assist;From bed;From elevated surface Sit to Stand: Patient Percentage: 40% Stand to Sit: 1: +2 Total assist;To chair/3-in-1 Stand to Sit: Patient Percentage: 40% Stand Pivot Transfers: 1: +2 Total assist Stand Pivot Transfers: Patient Percentage: 50% Details for Transfer Assistance: once standing pt did stay standing at RW, assisted pt to turn on R leg while limited weight placed on L leg.,     Exercises     PT Diagnosis:    PT Problem List:   PT Treatment Interventions:     PT Goals Acute Rehab PT Goals Potential to Achieve Goals: Good Pt will go Supine/Side to Sit: with min assist PT Goal: Supine/Side to Sit - Progress: Progressing toward goal Pt will go Sit to Stand: with mod assist PT Goal: Sit to Stand - Progress:  Progressing toward goal Pt will go Stand to Sit: with mod assist PT Goal: Stand to Sit - Progress: Progressing toward goal Pt will Transfer Bed to Chair/Chair to Bed: with mod assist PT Transfer Goal: Bed to Chair/Chair to Bed - Progress: Progressing toward goal  Visit Information  Last PT Received On: 01/26/12 Assistance Needed: +2    Subjective Data  Subjective: I fell off of the bed.   Cognition  Arousal/Alertness: Awake/alert Behavior During Session: WFL for tasks performed Cognition - Other Comments: pleasantly confused, able to state in hospital, and had a fall    Balance  Static Sitting Balance Static Sitting - Balance Support: Feet supported;Bilateral upper extremity supported Static Sitting - Level of Assistance: 5: Stand by assistance  End of Session PT - End of Session Activity Tolerance: Patient tolerated treatment well Patient left: in chair;with call bell/phone within reach Nurse Communication: Mobility status    Rada Hay 01/26/2012, 1:51 PM [phone]

## 2012-01-27 ENCOUNTER — Encounter (HOSPITAL_COMMUNITY): Payer: Self-pay | Admitting: Specialist

## 2012-01-27 LAB — BASIC METABOLIC PANEL
Calcium: 8.7 mg/dL (ref 8.4–10.5)
Chloride: 103 mEq/L (ref 96–112)
Creatinine, Ser: 0.65 mg/dL (ref 0.50–1.10)
GFR calc Af Amer: >90 mL/min (ref >90)

## 2012-01-27 LAB — CBC
MCH: 31.6 pg (ref 26.0–34.0)
MCV: 92.5 fL (ref 78.0–100.0)
Platelets: 224 10*3/uL (ref 150–400)
RDW: 12.1 % (ref 11.5–15.5)
WBC: 8.5 10*3/uL (ref 4.0–10.5)

## 2012-01-27 MED ORDER — ACETAMINOPHEN 325 MG PO TABS: 650.0000 mg | ORAL_TABLET | Freq: Four times a day (QID) | ORAL | Status: AC | PRN

## 2012-01-27 MED ORDER — DSS 100 MG PO CAPS: 100.0000 mg | ORAL_CAPSULE | Freq: Two times a day (BID) | ORAL | Status: AC

## 2012-01-27 MED ORDER — METHOCARBAMOL 500 MG PO TABS: 500.0000 mg | ORAL_TABLET | Freq: Four times a day (QID) | ORAL | Status: AC | PRN

## 2012-01-27 MED ORDER — HYDROCODONE-ACETAMINOPHEN 5-325 MG PO TABS: 1.0000 | ORAL_TABLET | ORAL | Status: AC | PRN

## 2012-01-27 MED ORDER — ASPIRIN 325 MG PO TABS: 325.0000 mg | ORAL_TABLET | Freq: Two times a day (BID) | ORAL | Status: AC

## 2012-01-27 NOTE — Progress Notes (Signed)
Report called to [facility] at [facility] home, awaiting transport for pending discharge. No acute distress noted.

## 2012-01-27 NOTE — Discharge Summary (Signed)
Patient ID: Anne Gonzales MRN: [id number] DOB/AGE: [birth year] 76 y.o.  Admit date: 01/23/2012 Discharge date: 01/27/2012  Admission Diagnoses:  Principal Problem:  *Femur fracture, left Active Problems:  Hypothyroidism  HTN (hypertension)  Closed left subtrochanteric femur fracture  Past Medical History  Diagnosis Date  . Dementia   . Hypertension   . Arthritis   . Hypothyroidism   . Vitamin d deficiency   . Hyperlipemia   . Anemia     b12 def  . Chronic kidney disease     kidney stones   Discharge Diagnoses:  Same   Surgeries: Procedure(s): INTRAMEDULLARY (IM) NAIL FEMORAL on 01/23/2012   Consultants: Treatment Team:  Penny Pia, MD  Discharged Condition: Improved  Hospital Course: Anne Gonzales is an 78 y.o. female who was admitted 01/23/2012 for operative treatment ofFemur fracture, left. Patient has severe unremitting pain that affects sleep, daily activities, and work/hobbies. After pre-op clearance the patient was taken to the operating room on 01/23/2012 and underwent  Procedure(s): INTRAMEDULLARY (IM) NAIL FEMORAL.    Patient was given perioperative antibiotics: Anti-infectives     Start     Dose/Rate Route Frequency Ordered Stop   01/24/12 0200   ceFAZolin (ANCEF) IVPB 1 g/50 mL premix        1 g 100 mL/hr over 30 Minutes Intravenous Every 6 hours 01/23/12 2322 01/24/12 1440   01/23/12 1800   ceFAZolin (ANCEF) IVPB 1 g/50 mL premix        1 g 100 mL/hr over 30 Minutes Intravenous On call 01/23/12 1749 01/23/12 2005           Patient was given sequential compression devices, early ambulation, and chemoprophylaxis to prevent DVT.  Patient benefited maximally from hospital stay and there were no complications.    Recent vital signs: Patient Vitals for the past 24 hrs:  BP Temp Temp src Pulse Resp SpO2  01/27/12 0545 137/77 mmHg 97.7 F (36.5 C) Oral 73  16  98 %  01/27/12 0000 - - - - 20  97 %  02-16-2012 2156 122/64 mmHg 98.5 F (36.9 C) Oral 88  16   96 %  Feb 16, 2012 2000 - - - - 16  -  02-16-12 1543 - - - - 16  99 %  02-16-12 1420 150/76 mmHg 98 F (36.7 C) Oral 90  18  98 %  02-16-2012 1148 - - - - 16  98 %     Recent laboratory studies:  Basename 01/27/12 0411 02-16-2012 0523  WBC 8.5 7.6  HGB 11.0* 10.4*  HCT 32.2* 30.5*  PLT 224 171  NA 134* 137  K 4.1 3.9  CL 103 105  CO2 22 24  BUN 19 23  CREATININE 0.65 0.80  GLUCOSE 117* 115*  INR -- --  CALCIUM 8.7 --     Discharge Medications:   Medication List  As of 01/27/2012  8:25 AM   STOP taking these medications         aspirin 325 MG EC tablet         TAKE these medications         acetaminophen 325 MG tablet   Commonly known as: TYLENOL   Take 2 tablets (650 mg total) by mouth every 6 (six) hours as needed (or Fever >/= 101).      aspirin 325 MG tablet   Take 1 tablet (325 mg total) by mouth 2 (two) times daily.      DSS 100 MG Caps   Take  100 mg by mouth 2 (two) times daily.      hydrochlorothiazide 12.5 MG capsule   Commonly known as: MICROZIDE   Take 12.5 mg by mouth daily.      HYDROcodone-acetaminophen 5-325 MG per tablet   Commonly known as: NORCO   Take 1-2 tablets by mouth every 4 (four) hours as needed.      levothyroxine 125 MCG tablet   Commonly known as: SYNTHROID, LEVOTHROID   Take 125 mcg by mouth daily.      methocarbamol 500 MG tablet   Commonly known as: ROBAXIN   Take 1 tablet (500 mg total) by mouth every 6 (six) hours as needed.      verapamil 240 MG CR tablet   Commonly known as: CALAN-SR   Take 240 mg by mouth daily.      vitamin B-12 500 MCG tablet   Commonly known as: CYANOCOBALAMIN   Take 500 mcg by mouth daily.      Vitamin D (Ergocalciferol) 50000 UNITS Caps   Commonly known as: DRISDOL   Take 50,000 Units by mouth every 7 (seven) days.            Diagnostic Studies: Dg Femur Left  01/24/2012  *RADIOLOGY REPORT*  Clinical Data: Operative reduction and internal fixation of the left femur.  Left proximal femur  fracture.  LEFT FEMUR - 2 VIEW OBTAINED ON SIX IMAGES  Comparison: 01/23/2012  Findings: Antegrade intramedullary femoral rod noted.  Interlocking intertrochanteric cortical screw is observed.  The femoral rod traverses the transverse fracture; on the lateral projection there is 8 mm of displacement without overlap or angulation.  The distal transverse interlocking screw is present in the distal femoral diaphysis.  IMPRESSION:  1.  Operative reduction and internal fixation of the left transverse femoral fracture, with markedly improved alignment and without complicating feature observed.  Original Report Authenticated By: Dellia Cloud, M.D.   Dg Pelvis Portable  01/23/2012  *RADIOLOGY REPORT*  Clinical Data: Fall with pelvic pain.  PORTABLE PELVIS  Comparison: None  Findings: A fracture of the left femur is noted on the edge of the film. There is no evidence of subluxation or dislocation. An intramedullary rod within the right femur is noted. Mild degenerative changes in both hips are present.  IMPRESSION: Left femur fracture on the edge of the film, not well characterized.  No other acute bony abnormalities identified.  Original Report Authenticated By: Rosendo Gros, M.D.   Dg Chest Port 1 View  01/23/2012  *RADIOLOGY REPORT*  Clinical Data: Fall with left femur fracture. Preoperative respiratory examination.  PORTABLE CHEST - 1 VIEW  Comparison: None  Findings: Upper limits normal heart size noted. Mild peribronchial thickening is likely chronic. There is no evidence of focal airspace disease, pulmonary edema, suspicious pulmonary nodule/mass, pleural effusion, or pneumothorax. No acute bony abnormalities are identified. Right shoulder hemiarthroplasty is identified.  IMPRESSION: No evidence of acute cardiopulmonary disease.  Upper limits normal heart size and mild peribronchial thickening.  Original Report Authenticated By: Rosendo Gros, M.D.   Dg Hip Portable 1 View Left  01/24/2012   *RADIOLOGY REPORT*  Clinical Data: Operative reduction and internal fixation of proximal femoral fracture.  PORTABLE LEFT HIP - 1 VIEW  Comparison: Fluoroscopic spot images from 01/23/2012 at 9:22 p.m.  Findings: The frontal radiograph of the left hip and proximal femur demonstrates antegrade left femoral intramedullary rod with interlocking intertrochanteric cortical screw.  The rod traverses the transverse fracture, with near anatomic alignment on the  frontal projection.  IMPRESSION:  1.  ORIF of left femoral fracture, without complicating feature observed.  Original Report Authenticated By: Dellia Cloud, M.D.   Dg Femur Left Port  01/23/2012  *RADIOLOGY REPORT*  Clinical Data: Fall, pain  PORTABLE LEFT FEMUR - 2 VIEW  Comparison: None.  Findings: There is a transverse fracture of the upper femur located at the junction of the proximal and middle thirds, with marked angulation and overriding fracture fragments. There is an obvious deformity of the leg.  IMPRESSION: Positive for femur fracture.  Original Report Authenticated By: Elsie Stain, M.D.   Dg C-arm 61-120 Min-no Report  01/23/2012  CLINICAL DATA: surgery   C-ARM 61-120 MINUTES  Fluoroscopy was utilized by the requesting physician.  No radiographic  interpretation.      Disposition: Final discharge disposition not confirmed  Discharge Orders    Future Orders Please Complete By Expires   Diet - low sodium heart healthy      Call MD / Call 911      Comments:   If you experience chest pain or shortness of breath, CALL 911 and be transported to the hospital emergency room.  If you develope a fever above 101 F, pus (white drainage) or increased drainage or redness at the wound, or calf pain, call your surgeon's office.   Constipation Prevention      Comments:   Drink plenty of fluids.  Prune juice may be helpful.  You may use a stool softener, such as Colace (over the counter) 100 mg twice a day.  Use MiraLax (over the counter) for  constipation as needed.   Increase activity slowly as tolerated      Weight Bearing as taught in Physical Therapy      Comments:   Use a walker or crutches as instructed.   Discharge instructions      Comments:   Change dressing daily left hip. Ok to shower. Ice to Incision prn.  PWB 25-50% left leg.      Follow-up Information    Follow up with BEANE,JEFFREY C, MD in 14 days. (from surgery)    Contact information:   [facility] [address], [address] [place] [address] [phone]           Signed: Liam Graham. 01/27/2012, 8:25 AM

## 2012-01-27 NOTE — Progress Notes (Signed)
Discharge summary sent to payer through [employer]  

## 2012-01-27 NOTE — Progress Notes (Signed)
Clinical Social Work Department CLINICAL SOCIAL WORK PLACEMENT NOTE 01/27/2012  Patient:  Anne Gonzales, Anne Gonzales  Account Number:  [id number] Admit date:  01/23/2012  Clinical Social Worker:  Cori Razor, LCSW  Date/time:  01/24/2012 03:59 AM  Clinical Social Work is seeking post-discharge placement for this patient at the following level of care:   SKILLED NURSING   (*CSW will update this form in Epic as items are completed)   01/24/2012  Patient/family provided with [facility] Department of Clinical Social Work's list of facilities offering this level of care within the geographic area requested by the patient (or if unable, by the patient's family).  01/24/2012  Patient/family informed of their freedom to choose among providers that offer the needed level of care, that participate in Medicare, Medicaid or managed care program needed by the patient, have an available bed and are willing to accept the patient.    Patient/family informed of [facility]' ownership interest in [facility], as well as of the fact that they are under no obligation to receive care at this facility.  PASARR submitted to EDS on  PASARR number received from EDS on   FL2 transmitted to all facilities in geographic area requested by pt/family on  01/24/2012 FL2 transmitted to all facilities within larger geographic area on   Patient informed that his/her managed care company has contracts with or will negotiate with  certain facilities, including the following:     Patient/family informed of bed offers received:  01/24/2012 Patient chooses bed at [facility] Physician recommends and patient chooses bed at    Patient to be transferred to [facility] on  01/27/2012 Patient to be transferred to facility by [facility]  The following physician request were entered in Epic:   Additional Comments:   [employer] provided approval for SNF and ambulance transport for this pt on  01/27/12.   Cori Razor  LCSW [phone]

## 2012-01-29 ENCOUNTER — Encounter (HOSPITAL_COMMUNITY): Payer: Self-pay

## 2012-04-10 ENCOUNTER — Ambulatory Visit (INDEPENDENT_AMBULATORY_CARE_PROVIDER_SITE_OTHER): Payer: Medicare Other | Admitting: Family Medicine

## 2012-04-10 VITALS — BP 104/58 | HR 82 | Temp 97.9°F | Resp 18 | Wt 138.0 lb

## 2012-04-10 DIAGNOSIS — Z111 Encounter for screening for respiratory tuberculosis: Secondary | ICD-10-CM

## 2012-04-10 NOTE — Progress Notes (Signed)
[facility] [address] [place] [address] [phone]  Date:  04/10/2012   Name:  Anne Gonzales   DOB:  Jan 17, 1923   MRN:  [id number]  PCP:  No primary provider on file.    Chief Complaint: PPD Reading   History of Present Illness:  Anne Gonzales is a 78 y.o. very pleasant female patient who presents with the following:  Here to have her PPD read- it was placed by her PCP at [facility].  She has with her a form detailing her history and the placement of her PPD on her right volar forearm prior to 12:15 pm on 04/08/12. Anne Gonzales is here with her daughter today- they are planning to place her in SNF as she needs constant care and observation at this time in her life  Patient Active Problem List  Diagnosis  . Hypothyroidism  . HTN (hypertension)  . Closed left subtrochanteric femur fracture  . Femur fracture, left    Past Medical History  Diagnosis Date  . Dementia   . Hypertension   . Arthritis   . Hypothyroidism   . Vitamin d deficiency   . Hyperlipemia   . Anemia     b12 def  . Chronic kidney disease     kidney stones    Past Surgical History  Procedure Date  . Fracture surgery     R Leg  . Rotator cuff repair     Left  . Lithotripsy   . Cataract extraction   . Femur im nail 01/23/2012    Procedure: INTRAMEDULLARY (IM) NAIL FEMORAL;  Surgeon: Javier Docker, MD;  Location: [facility];  Service: Orthopedics;  Laterality: Left;    History  Substance Use Topics  . Smoking status: Never Smoker   . Smokeless tobacco: Never Used  . Alcohol Use: No    No family history on file.  No Known Allergies  Medication list has been reviewed and updated.  Current Outpatient Prescriptions on File Prior to Visit  Medication Sig Dispense Refill  . hydrochlorothiazide (MICROZIDE) 12.5 MG capsule Take 12.5 mg by mouth daily.      Marland Kitchen levothyroxine (SYNTHROID, LEVOTHROID) 125 MCG tablet Take 125 mcg by mouth daily.      . verapamil (CALAN-SR) 240 MG CR tablet  Take 240 mg by mouth daily.      . vitamin B-12 (CYANOCOBALAMIN) 500 MCG tablet Take 500 mcg by mouth daily.      . Vitamin D, Ergocalciferol, (DRISDOL) 50000 UNITS CAPS Take 50,000 Units by mouth every 7 (seven) days.      Marland Kitchen acetaminophen (TYLENOL) 325 MG tablet Take 2 tablets (650 mg total) by mouth every 6 (six) hours as needed (or Fever >/= 101).  60 tablet  0  . aspirin 325 MG tablet Take 1 tablet (325 mg total) by mouth 2 (two) times daily.        Review of Systems:  As per HPI- otherwise negative.   Physical Examination: Filed Vitals:   04/10/12 1349  BP: 104/58  Pulse: 82  Temp: 97.9 F (36.6 C)  Resp: 18   Filed Vitals:   04/10/12 1349  Weight: 138 lb (62.596 kg)   There is no height on file to calculate BMI. Ideal Body Weight:     GEN: WDWN, NAD, Non-toxic, Alert & Oriented x 3 HEENT: Atraumatic, Normocephalic.  Ears and Nose: No external deformity. EXTR: No clubbing/cyanosis/edema NEURO: slow gait, uses a walker PSYCH: Normally interactive. Conversant. Not depressed  or anxious appearing.  Calm demeanor.  No induration noted at PPD site.    Assessment and Plan: 1. Screening-pulmonary TB    Negative PPD today.  Letter for patient to take to SNF.    Abbe Amsterdam, MD

## 2012-10-04 ENCOUNTER — Other Ambulatory Visit: Payer: Self-pay | Admitting: *Deleted

## 2012-10-04 ENCOUNTER — Ambulatory Visit (INDEPENDENT_AMBULATORY_CARE_PROVIDER_SITE_OTHER): Payer: Medicare Other | Admitting: Family Medicine

## 2012-10-04 ENCOUNTER — Encounter: Payer: Self-pay | Admitting: Family Medicine

## 2012-10-04 VITALS — BP 107/65 | HR 84 | Temp 98.5°F | Resp 16

## 2012-10-04 DIAGNOSIS — R1110 Vomiting, unspecified: Secondary | ICD-10-CM

## 2012-10-04 MED ORDER — ONDANSETRON 4 MG PO TBDP: 4.0000 mg | ORAL_TABLET | Freq: Three times a day (TID) | ORAL | Status: DC | PRN

## 2012-10-04 NOTE — Progress Notes (Signed)
Subjective:    Patient ID: Anne Gonzales, female    DOB: 02-26-1923, 79 y.o.   MRN: [id number] Chief Complaint  Patient presents with  . N/V    x today    HPI  Anne Gonzales is a pleseant 79 yo woman with dementia who resides in a nursing facility and is brought here by her daughter - her [facility] POA. She was fine yest. After breakfast today she threw up and couldn't eat lunch.  Very unusual.  No abd pain, no nausea currently, no vomiting since breakfast. But no appetite - although daughter states that whenever you ask her if she is hungry - she always says no and then eats a ton so daughter suspects that she will eat dinner fine.  No diarrhea or constipation.  Lat BM unknown.  Wears depends and is incontinent of urine but seems to be having a nml urine output and is drinking fine.  Daughter also notes that she was started on aricept recently. Daughter not sure she wants her on it and she has developed a large velocity tremor which she wonders if it could be related.  She wonders if her dementia is to far progressed already for it to be of much help and she wants to minimize medications and interventions in her mother. She will plan to discuss this w/ her mother's PCP at their next visit.  Past Medical History  Diagnosis Date  . Dementia   . Hypertension   . Arthritis   . Hypothyroidism   . Vitamin D deficiency   . Hyperlipemia   . Anemia     b12 def  . Chronic kidney disease     kidney stones   Current Outpatient Prescriptions on File Prior to Visit  Medication Sig Dispense Refill  . acetaminophen (TYLENOL) 325 MG tablet Take 2 tablets (650 mg total) by mouth every 6 (six) hours as needed (or Fever >/= 101).  60 tablet  0  . aspirin 325 MG tablet Take 1 tablet (325 mg total) by mouth 2 (two) times daily.      Marland Kitchen donepezil (ARICEPT) 5 MG tablet Take 5 mg by mouth daily.      . hydrochlorothiazide (MICROZIDE) 12.5 MG capsule Take 12.5 mg by mouth daily.      Marland Kitchen levothyroxine (SYNTHROID, LEVOTHROID)  125 MCG tablet Take 125 mcg by mouth daily.      . verapamil (CALAN-SR) 240 MG CR tablet Take 240 mg by mouth daily.      . vitamin B-12 (CYANOCOBALAMIN) 500 MCG tablet Take 500 mcg by mouth daily.      . Vitamin D, Ergocalciferol, (DRISDOL) 50000 UNITS CAPS Take 50,000 Units by mouth every 7 (seven) days.       No Known Allergies History   Social History  . Marital Status: Widowed    Spouse Name: N/A    Number of Children: N/A  . Years of Education: N/A   Social History Main Topics  . Smoking status: Never Smoker   . Smokeless tobacco: Never Used  . Alcohol Use: No  . Drug Use: No  . Sexually Active: No   Other Topics Concern  . None   Social History Narrative  . None     Review of Systems  Constitutional: Positive for appetite change. Negative for fever, chills, diaphoresis, activity change and unexpected weight change.  Respiratory: Negative for shortness of breath.   Cardiovascular: Negative for chest pain and leg swelling.  Gastrointestinal: Negative for nausea, vomiting, abdominal  pain, diarrhea, constipation, blood in stool, abdominal distention and anal bleeding.  Genitourinary: Negative for dysuria, decreased urine volume and difficulty urinating.  Musculoskeletal: Positive for arthralgias and gait problem.  Skin: Negative for rash.  Hematological: Negative for adenopathy. Bruises/bleeds easily.  Psychiatric/Behavioral: Negative for sleep disturbance.      BP 107/65  Pulse 84  Temp 98.5 F (36.9 C) (Oral)  Resp 16  SpO2 99% Objective:   Physical Exam  Constitutional: She appears well-developed and well-nourished. No distress.  HENT:  Head: Normocephalic and atraumatic.  Neck: Normal range of motion. Neck supple. No thyromegaly present.  Cardiovascular: Normal rate, regular rhythm, normal heart sounds and intact distal pulses.   Pulmonary/Chest: Effort normal and breath sounds normal. No respiratory distress.  Abdominal: Soft. Bowel sounds are normal.  She exhibits no distension and no mass. There is no tenderness. There is no rebound, no guarding and no CVA tenderness.  Musculoskeletal: She exhibits no edema.  Lymphadenopathy:    She has no cervical adenopathy.  Neurological: She is alert.  Skin: Skin is warm and dry. She is not diaphoretic. No erythema.  Psychiatric: She has a normal mood and affect. Her behavior is normal.      Assessment & Plan:   1. Vomiting  DISCONTINUED: ondansetron (ZOFRAN ODT) 4 MG disintegrating tablet  Pt given zofran 4 mg sl x 1 in office - she drank a glass of water and some gatorade w/o nausea or vomiting.  Sent a rx for zofran to her pharmacy but pt could not afford it and do not want to use promethazine due to pt's age and dementia so offered daughter to come back to clinic to pick up 2 doses of zofran in case they are needed.  I suspect this was just an isolated episode of indigestion but certainly viral gastroenteritis has a large chance of spreading through a SNF so if any abd pain, diarrhea, or further n/v - RTC immed for further eval.  Daughter very agreeable w/ plan and agrees w/ suspected diagnosis of isolated indigestion. Meds ordered this encounter  Medications         . ondansetron (ZOFRAN ODT) 4 MG disintegrating tablet    Sig: Take 1 tablet (4 mg total) by mouth every 8 (eight) hours as needed for nausea.    Dispense:  20 tablet    Refill:  0                  RTC prn

## 2012-10-04 NOTE — Patient Instructions (Addendum)
Nausea and Vomiting Nausea is a sick feeling that often comes before throwing up (vomiting). Vomiting is a reflex where stomach contents come out of your mouth. Vomiting can cause severe loss of body fluids (dehydration). Children and elderly adults can become dehydrated quickly, especially if they also have diarrhea. Nausea and vomiting are symptoms of a condition or disease. It is important to find the cause of your symptoms. CAUSES   Direct irritation of the stomach lining. This irritation can result from increased acid production (gastroesophageal reflux disease), infection, food poisoning, taking certain medicines (such as nonsteroidal anti-inflammatory drugs), alcohol use, or tobacco use.  Signals from the brain.These signals could be caused by a headache, heat exposure, an inner ear disturbance, increased pressure in the brain from injury, infection, a tumor, or a concussion, pain, emotional stimulus, or metabolic problems.  An obstruction in the gastrointestinal tract (bowel obstruction).  Illnesses such as diabetes, hepatitis, gallbladder problems, appendicitis, kidney problems, cancer, sepsis, atypical symptoms of a heart attack, or eating disorders.  Medical treatments such as chemotherapy and radiation.  Receiving medicine that makes you sleep (general anesthetic) during surgery. DIAGNOSIS Your caregiver may ask for tests to be done if the problems do not improve after a few days. Tests may also be done if symptoms are severe or if the reason for the nausea and vomiting is not clear. Tests may include:  Urine tests.  Blood tests.  Stool tests.  Cultures (to look for evidence of infection).  X-rays or other imaging studies. Test results can help your caregiver make decisions about treatment or the need for additional tests. TREATMENT You need to stay well hydrated. Drink frequently but in small amounts.You may wish to drink water, sports drinks, clear broth, or eat frozen  ice pops or gelatin dessert to help stay hydrated.When you eat, eating slowly may help prevent nausea.There are also some antinausea medicines that may help prevent nausea. HOME CARE INSTRUCTIONS   Take all medicine as directed by your caregiver.  If you do not have an appetite, do not force yourself to eat. However, you must continue to drink fluids.  If you have an appetite, eat a normal diet unless your caregiver tells you differently.  Eat a variety of complex carbohydrates (rice, wheat, potatoes, bread), lean meats, yogurt, fruits, and vegetables.  Avoid high-fat foods because they are more difficult to digest.  Drink enough water and fluids to keep your urine clear or pale yellow.  If you are dehydrated, ask your caregiver for specific rehydration instructions. Signs of dehydration may include:  Severe thirst.  Dry lips and mouth.  Dizziness.  Dark urine.  Decreasing urine frequency and amount.  Confusion.  Rapid breathing or pulse. SEEK IMMEDIATE MEDICAL CARE IF:   You have blood or brown flecks (like coffee grounds) in your vomit.  You have black or bloody stools.  You have a severe headache or stiff neck.  You are confused.  You have severe abdominal pain.  You have chest pain or trouble breathing.  You do not urinate at least once every 8 hours.  You develop cold or clammy skin.  You continue to vomit for longer than 24 to 48 hours.  You have a fever. MAKE SURE YOU:   Understand these instructions.  Will watch your condition.  Will get help right away if you are not doing well or get worse. Document Released: 08/26/2005 Document Revised: 11/18/2011 Document Reviewed: 01/23/2011 [employer] Patient Information 2013 [employer].  

## 2013-05-31 ENCOUNTER — Encounter (HOSPITAL_COMMUNITY): Payer: Self-pay | Admitting: *Deleted

## 2013-05-31 ENCOUNTER — Emergency Department (HOSPITAL_COMMUNITY)
Admission: EM | Admit: 2013-05-31 | Discharge: 2013-05-31 | Disposition: A | Discharge status: Home / Self Care | Payer: Medicare Other | Attending: Emergency Medicine | Admitting: Emergency Medicine

## 2013-05-31 DIAGNOSIS — I129 Hypertensive chronic kidney disease with stage 1 through stage 4 chronic kidney disease, or unspecified chronic kidney disease: Secondary | ICD-10-CM | POA: Insufficient documentation

## 2013-05-31 DIAGNOSIS — Y939 Activity, unspecified: Secondary | ICD-10-CM | POA: Insufficient documentation

## 2013-05-31 DIAGNOSIS — N189 Chronic kidney disease, unspecified: Secondary | ICD-10-CM | POA: Insufficient documentation

## 2013-05-31 DIAGNOSIS — Z8639 Personal history of other endocrine, nutritional and metabolic disease: Secondary | ICD-10-CM | POA: Insufficient documentation

## 2013-05-31 DIAGNOSIS — F0390 Unspecified dementia without behavioral disturbance: Secondary | ICD-10-CM | POA: Insufficient documentation

## 2013-05-31 DIAGNOSIS — Z79899 Other long term (current) drug therapy: Secondary | ICD-10-CM | POA: Insufficient documentation

## 2013-05-31 DIAGNOSIS — S0083XA Contusion of other part of head, initial encounter: Secondary | ICD-10-CM

## 2013-05-31 DIAGNOSIS — Z862 Personal history of diseases of the blood and blood-forming organs and certain disorders involving the immune mechanism: Secondary | ICD-10-CM | POA: Insufficient documentation

## 2013-05-31 DIAGNOSIS — E559 Vitamin D deficiency, unspecified: Secondary | ICD-10-CM | POA: Insufficient documentation

## 2013-05-31 DIAGNOSIS — W19XXXA Unspecified fall, initial encounter: Secondary | ICD-10-CM | POA: Insufficient documentation

## 2013-05-31 DIAGNOSIS — E039 Hypothyroidism, unspecified: Secondary | ICD-10-CM | POA: Insufficient documentation

## 2013-05-31 DIAGNOSIS — Y9210 Unspecified residential institution as the place of occurrence of the external cause: Secondary | ICD-10-CM | POA: Insufficient documentation

## 2013-05-31 DIAGNOSIS — Z87442 Personal history of urinary calculi: Secondary | ICD-10-CM | POA: Insufficient documentation

## 2013-05-31 DIAGNOSIS — S0003XA Contusion of scalp, initial encounter: Secondary | ICD-10-CM | POA: Insufficient documentation

## 2013-05-31 DIAGNOSIS — E538 Deficiency of other specified B group vitamins: Secondary | ICD-10-CM | POA: Insufficient documentation

## 2013-05-31 DIAGNOSIS — M129 Arthropathy, unspecified: Secondary | ICD-10-CM | POA: Insufficient documentation

## 2013-05-31 NOTE — ED Provider Notes (Signed)
CSN: [id number]     Arrival date & time 05/31/13  1478 History   First MD Initiated Contact with Patient 05/31/13 [phone]     Chief Complaint  Patient presents with  . Fall   (Consider location/radiation/quality/duration/timing/severity/associated sxs/prior Treatment) Patient is a 79 y.o. female presenting with fall. The history is provided by the patient and the EMS personnel. The history is limited by the condition of the patient.  Fall Pertinent negatives include no headaches.  pt with hx advanced dementia, arrives via ems from ecf.  Pt at [facility], was noted on knees, trying to get up. No loc noted. When found by staff and en route to ED, pts mental status noted c/w baseline. No report of fever or recent illness. Pt w advanced dementia - limited historian - level 5 caveat.  On arrival to ED, pt smiling, content, denies any pain.      Past Medical History  Diagnosis Date  . Dementia   . Hypertension   . Arthritis   . Hypothyroidism   . Vitamin D deficiency   . Hyperlipemia   . Anemia     b12 def  . Chronic kidney disease     kidney stones   Past Surgical History  Procedure Laterality Date  . Fracture surgery      R Leg  . Rotator cuff repair      Left  . Lithotripsy    . Cataract extraction    . Femur im nail  01/23/2012    Procedure: INTRAMEDULLARY (IM) NAIL FEMORAL;  Surgeon: Javier Docker, MD;  Location: [facility];  Service: Orthopedics;  Laterality: Left;   No family history on file. History  Substance Use Topics  . Smoking status: Never Smoker   . Smokeless tobacco: Never Used  . Alcohol Use: No   OB History   Grav Para Term Preterm Abortions TAB SAB Ect Mult Living                 Review of Systems  Unable to perform ROS: Dementia  Constitutional: Negative for fever.  HENT: Negative for neck pain.   Neurological: Negative for headaches.  level 5 caveat.  Allergies  Review of patient's allergies indicates no known allergies.  Home Medications    Current Outpatient Rx  Name  Route  Sig  Dispense  Refill  . donepezil (ARICEPT) 5 MG tablet   Oral   Take 5 mg by mouth daily.         . hydrochlorothiazide (MICROZIDE) 12.5 MG capsule   Oral   Take 12.5 mg by mouth daily.         Marland Kitchen levothyroxine (SYNTHROID, LEVOTHROID) 125 MCG tablet   Oral   Take 125 mcg by mouth daily.         . ondansetron (ZOFRAN ODT) 4 MG disintegrating tablet   Oral   Take 1 tablet (4 mg total) by mouth every 8 (eight) hours as needed for nausea.   20 tablet   0   . verapamil (CALAN-SR) 240 MG CR tablet   Oral   Take 240 mg by mouth daily.         . vitamin B-12 (CYANOCOBALAMIN) 500 MCG tablet   Oral   Take 500 mcg by mouth daily.         . Vitamin D, Ergocalciferol, (DRISDOL) 50000 UNITS CAPS   Oral   Take 50,000 Units by mouth every 7 (seven) days.  BP 180/68  Pulse 71  Temp(Src) 97.8 F (36.6 C) (Oral)  Resp 18  SpO2 100% Physical Exam  Nursing note and vitals reviewed. Constitutional: She appears well-developed and well-nourished. No distress.  HENT:  Mouth/Throat: Oropharynx is clear and moist.  Minimal contusion to forehead at hairline.  Eyes: Conjunctivae and EOM are normal. Pupils are equal, round, and reactive to light. No scleral icterus.  Neck: Normal range of motion. Neck supple. No tracheal deviation present.  Cardiovascular: Normal rate, normal heart sounds and intact distal pulses.   Pulmonary/Chest: Effort normal and breath sounds normal. No respiratory distress. She exhibits no tenderness.  Abdominal: Soft. Normal appearance and bowel sounds are normal. She exhibits no distension and no mass. There is no tenderness. There is no rebound and no guarding.  Genitourinary:  No cva tenderness.   Musculoskeletal: Normal range of motion. She exhibits no edema and no tenderness.  Good rom bil extremities without pain or focal bony tenderness. No deformity, shortening or rotational deformity noted. Distal  pulses palp.   CTLS spine, non tender, aligned, no step off.   Neurological: She is alert.  Alert, smiling, content. Moves bil ext purposefully w good strength. Confusion/dementia - pts mental state reported as c/w baseline.   Skin: Skin is warm and dry. No rash noted. She is not diaphoretic.  Psychiatric: She has a normal mood and affect.    ED Course  Procedures (including critical care time)   MDM  Reviewed nursing notes and prior charts for additional history.   Pt w no loc, and mental status that has remained consistent w baseline. Pt denies any c/o or pain.  Remains smiling, alert, and content.  Pt appears stable for d/c to ecf.     Suzi Roots, MD 05/31/13 [phone]

## 2013-05-31 NOTE — ED Notes (Signed)
Bed: WA20 Expected date:  Expected time:  Means of arrival:  Comments: ems- 79 yo F, fall, hematoma on head

## 2013-05-31 NOTE — ED Notes (Signed)
Per EMS pt coming from [facility] with c/o un-witnessed fall. Per EMS pt c/o forehead pain and has hematoma on forehead. EMS reports pt has hx of dementia.

## 2013-05-31 NOTE — Progress Notes (Signed)
   CARE MANAGEMENT ED NOTE 05/31/2013  Patient:  JENNEL, MARA   Account Number:  [id number]  Date Initiated:  05/31/2013  Documentation initiated by:  Edd Arbour  Subjective/Objective Assessment:     Subjective/Objective Assessment Detail:     Action/Plan:   Action/Plan Detail:   CM noted pt without pcp listed Forms from [facility] facility indicates pcp is Dr Aida Puffer Cm updated EPIC   Anticipated DC Date:  05/31/2013     Status Recommendation to Physician:   Result of Recommendation:    Other ED Services  Consult Working Plan    DC Planning Services  Other  PCP issues  Outpatient Services - Pt will follow up    Choice offered to / List presented to:            Status of service:  Completed, signed off  ED Comments:   ED Comments Detail:

## 2013-05-31 NOTE — ED Notes (Signed)
[facility] paged to transfer pt.

## 2013-09-07 ENCOUNTER — Emergency Department (HOSPITAL_COMMUNITY): Payer: Medicare Other

## 2013-09-07 ENCOUNTER — Encounter (HOSPITAL_COMMUNITY): Payer: Self-pay | Admitting: Emergency Medicine

## 2013-09-07 ENCOUNTER — Emergency Department (HOSPITAL_COMMUNITY)
Admission: EM | Admit: 2013-09-07 | Discharge: 2013-09-08 | Disposition: A | Discharge status: Home / Self Care | Payer: Medicare Other | Attending: Emergency Medicine | Admitting: Emergency Medicine

## 2013-09-07 DIAGNOSIS — N189 Chronic kidney disease, unspecified: Secondary | ICD-10-CM | POA: Insufficient documentation

## 2013-09-07 DIAGNOSIS — E538 Deficiency of other specified B group vitamins: Secondary | ICD-10-CM | POA: Insufficient documentation

## 2013-09-07 DIAGNOSIS — E039 Hypothyroidism, unspecified: Secondary | ICD-10-CM | POA: Insufficient documentation

## 2013-09-07 DIAGNOSIS — E559 Vitamin D deficiency, unspecified: Secondary | ICD-10-CM | POA: Insufficient documentation

## 2013-09-07 DIAGNOSIS — W07XXXA Fall from chair, initial encounter: Secondary | ICD-10-CM | POA: Insufficient documentation

## 2013-09-07 DIAGNOSIS — N390 Urinary tract infection, site not specified: Secondary | ICD-10-CM | POA: Insufficient documentation

## 2013-09-07 DIAGNOSIS — Z87442 Personal history of urinary calculi: Secondary | ICD-10-CM | POA: Insufficient documentation

## 2013-09-07 DIAGNOSIS — Z862 Personal history of diseases of the blood and blood-forming organs and certain disorders involving the immune mechanism: Secondary | ICD-10-CM | POA: Insufficient documentation

## 2013-09-07 DIAGNOSIS — I129 Hypertensive chronic kidney disease with stage 1 through stage 4 chronic kidney disease, or unspecified chronic kidney disease: Secondary | ICD-10-CM | POA: Insufficient documentation

## 2013-09-07 DIAGNOSIS — IMO0002 Reserved for concepts with insufficient information to code with codable children: Secondary | ICD-10-CM | POA: Insufficient documentation

## 2013-09-07 DIAGNOSIS — Z8639 Personal history of other endocrine, nutritional and metabolic disease: Secondary | ICD-10-CM | POA: Insufficient documentation

## 2013-09-07 DIAGNOSIS — F0390 Unspecified dementia without behavioral disturbance: Secondary | ICD-10-CM | POA: Insufficient documentation

## 2013-09-07 DIAGNOSIS — M129 Arthropathy, unspecified: Secondary | ICD-10-CM | POA: Insufficient documentation

## 2013-09-07 DIAGNOSIS — S0003XA Contusion of scalp, initial encounter: Secondary | ICD-10-CM | POA: Insufficient documentation

## 2013-09-07 DIAGNOSIS — Y9210 Unspecified residential institution as the place of occurrence of the external cause: Secondary | ICD-10-CM | POA: Insufficient documentation

## 2013-09-07 DIAGNOSIS — S0990XA Unspecified injury of head, initial encounter: Secondary | ICD-10-CM

## 2013-09-07 DIAGNOSIS — Z79899 Other long term (current) drug therapy: Secondary | ICD-10-CM | POA: Insufficient documentation

## 2013-09-07 DIAGNOSIS — W19XXXA Unspecified fall, initial encounter: Secondary | ICD-10-CM

## 2013-09-07 DIAGNOSIS — Y9389 Activity, other specified: Secondary | ICD-10-CM | POA: Insufficient documentation

## 2013-09-07 LAB — URINALYSIS, ROUTINE W REFLEX MICROSCOPIC
Glucose, UA: NEGATIVE mg/dL
Protein, ur: 30 mg/dL — AB

## 2013-09-07 LAB — BASIC METABOLIC PANEL
CO2: 23 mEq/L (ref 19–32)
Calcium: 9.1 mg/dL (ref 8.4–10.5)
Chloride: 103 mEq/L (ref 96–112)
Sodium: 139 mEq/L (ref 137–147)

## 2013-09-07 LAB — CBC WITH DIFFERENTIAL/PLATELET
Basophils Absolute: 0 10*3/uL (ref 0.0–0.1)
Lymphocytes Relative: 13 % (ref 12–46)
Neutro Abs: 7.5 10*3/uL (ref 1.7–7.7)
Neutrophils Relative %: 81 % — ABNORMAL HIGH (ref 43–77)
Platelets: 238 10*3/uL (ref 150–400)
RDW: 12.3 % (ref 11.5–15.5)
WBC: 9.3 10*3/uL (ref 4.0–10.5)

## 2013-09-07 LAB — URINE MICROSCOPIC-ADD ON

## 2013-09-07 MED ORDER — CEPHALEXIN 500 MG PO CAPS: 500.0000 mg | ORAL_CAPSULE | Freq: Four times a day (QID) | ORAL | Status: DC

## 2013-09-07 MED ORDER — CEPHALEXIN 250 MG PO CAPS
500.0000 mg | ORAL_CAPSULE | Freq: Once | ORAL | Status: AC
  Administered 2013-09-07: 500 mg via ORAL
  Filled 2013-09-07: qty 2

## 2013-09-07 NOTE — ED Provider Notes (Signed)
CSN: [id number]     Arrival date & time 09/07/13  1730 History   First MD Initiated Contact with Patient 09/07/13 1735     Chief Complaint  Patient presents with  . Fall   (Consider location/radiation/quality/duration/timing/severity/associated sxs/prior Treatment) HPI Comments: Anne Gonzales is a 79 y.o. Female with dementia, who fell out of the chair. She is unable to give history. She lives in an assisted living facility. There is no reported loss of consciousness or other recent problems.   Level V caveat: Dementia   Patient is a 79 y.o. female presenting with fall. The history is provided by the patient.  Fall    Past Medical History  Diagnosis Date  . Dementia   . Hypertension   . Arthritis   . Hypothyroidism   . Vitamin D deficiency   . Hyperlipemia   . Anemia     b12 def  . Chronic kidney disease     kidney stones   Past Surgical History  Procedure Laterality Date  . Fracture surgery      R Leg  . Rotator cuff repair      Left  . Lithotripsy    . Cataract extraction    . Femur im nail  01/23/2012    Procedure: INTRAMEDULLARY (IM) NAIL FEMORAL;  Surgeon: Javier Docker, MD;  Location: [facility];  Service: Orthopedics;  Laterality: Left;   No family history on file. History  Substance Use Topics  . Smoking status: Never Smoker   . Smokeless tobacco: Never Used  . Alcohol Use: No   OB History   Grav Para Term Preterm Abortions TAB SAB Ect Mult Living                 Review of Systems  Unable to perform ROS   Allergies  Review of patient's allergies indicates no known allergies.  Home Medications   Current Outpatient Rx  Name  Route  Sig  Dispense  Refill  . cyanocobalamin (,VITAMIN B-12,) 1000 MCG/ML injection   Intramuscular   Inject 1,000 mcg into the muscle every 30 (thirty) days. On or about the 20th of each month         . levothyroxine (SYNTHROID, LEVOTHROID) 125 MCG tablet   Oral   Take 125 mcg by mouth daily.         . verapamil  (CALAN) 120 MG tablet   Oral   Take 240 mg by mouth daily.         . vitamin B-12 (CYANOCOBALAMIN) 500 MCG tablet   Oral   Take 500 mcg by mouth daily.         . Vitamin D, Ergocalciferol, (DRISDOL) 50000 UNITS CAPS   Oral   Take 50,000 Units by mouth every 7 (seven) days. On Wednesdays         . cephALEXin (KEFLEX) 500 MG capsule   Oral   Take 1 capsule (500 mg total) by mouth 4 (four) times daily.   28 capsule   0    BP 125/66  Pulse 78  Temp(Src) 98.7 F (37.1 C) (Oral)  Resp 20  SpO2 99% Physical Exam  Nursing note and vitals reviewed. Constitutional: She appears well-developed.  Elderly, frail  HENT:  Head: Normocephalic.  Flat contusion, left forehead and abrasion, nose. No deformity or crepitation.   Eyes: Conjunctivae and EOM are normal. Pupils are equal, round, and reactive to light.  Neck: Normal range of motion and phonation normal. Neck supple.  Cardiovascular: Normal  rate, regular rhythm and intact distal pulses.   Pulmonary/Chest: Effort normal and breath sounds normal. She exhibits no tenderness.  Abdominal: Soft. She exhibits no distension. There is no tenderness. There is no guarding.  Musculoskeletal: Normal range of motion.  Neurological: She is alert. She exhibits normal muscle tone.  No dysarthria. She is able to follow simple commands. She has diminished strength left leg compared to the right  Skin: Skin is warm and dry.  Psychiatric: She has a normal mood and affect. Her behavior is normal.    ED Course  Procedures (including critical care time) Labs Review Labs Reviewed  CBC WITH DIFFERENTIAL - Abnormal; Notable for the following:    Neutrophils Relative % 81 (*)    All other components within normal limits  BASIC METABOLIC PANEL - Abnormal; Notable for the following:    Glucose, Bld 131 (*)    GFR calc non Af Amer 45 (*)    GFR calc Af Amer 53 (*)    All other components within normal limits  URINALYSIS, ROUTINE W REFLEX  MICROSCOPIC - Abnormal; Notable for the following:    APPearance CLOUDY (*)    Hgb urine dipstick MODERATE (*)    Protein, ur 30 (*)    Leukocytes, UA LARGE (*)    All other components within normal limits  URINE MICROSCOPIC-ADD ON - Abnormal; Notable for the following:    Bacteria, UA MANY (*)    All other components within normal limits  URINE CULTURE   Imaging Review Ct Head Wo Contrast  09/07/2013   CLINICAL DATA:  Patient fell out of a chair. Patient denies any pain.  EXAM: CT HEAD WITHOUT CONTRAST  TECHNIQUE: Contiguous axial images were obtained from the base of the skull through the vertex without intravenous contrast.  COMPARISON:  None.  FINDINGS: There is no evidence of mass effect, midline shift, or extra-axial fluid collections. There is no evidence of a space-occupying lesion or intracranial hemorrhage. There is no evidence of a cortical-based area of acute infarction. There is generalized cerebral atrophy. There is periventricular white matter low attenuation likely secondary to microangiopathy.  The ventricles and sulci are appropriate for the patient's age. The basal cisterns are patent.  Visualized portions of the orbits are unremarkable. The visualized portions of the paranasal sinuses and mastoid air cells are unremarkable.  The osseous structures are unremarkable.  IMPRESSION: No acute intracranial pathology.   Electronically Signed   By: Elige Ko   On: 09/07/2013 18:44    EKG Interpretation   None       MDM   1. Fall, initial encounter   2. Head injury, initial encounter   3. UTI (urinary tract infection)    Fall with minor injuries. Urinary tract infection is apparent, on evaluation. This could have contributed to the fall. There is no evidence for sepsis, metabolic instability, or impending vascular collapse,  The patient appears reasonably screened and/or stabilized for discharge and I doubt any other medical condition or other [facility] requiring further  screening, evaluation, or treatment in the ED at this time prior to discharge.  Nursing Notes Reviewed/ Care Coordinated, and agree without changes. Applicable Imaging Reviewed.  Interpretation of Laboratory Data incorporated into ED treatment   Plan: Home Medications- Keflex; Home Treatments and Observation- rest, fluid; return here if the recommended treatment, does not improve the symptoms; Recommended follow up- return here if needed. Follow up with PCP in one week      Flint Melter, MD  09/07/13 2331 

## 2013-09-07 NOTE — ED Notes (Signed)
Per [facility], pt was in a chair and fell out. Pt from [facility]. Pt does have dementia and states she fell out of the chair. Denies any pain.

## 2013-09-08 NOTE — ED Notes (Signed)
[facility] to take pt home.

## 2013-09-10 LAB — URINE CULTURE: Colony Count: >=100000

## 2013-09-11 ENCOUNTER — Telehealth (HOSPITAL_COMMUNITY): Payer: Self-pay | Admitting: Emergency Medicine

## 2013-09-11 NOTE — ED Notes (Signed)
Post ED Visit - Positive Culture Follow-up  Culture report reviewed by antimicrobial stewardship pharmacist: [x]  Wes Dulaney, Pharm.D., BCPS []  Celedonio MiyamotoJeremy Frens, Pharm.D., BCPS []  Georgina PillionElizabeth Martin, Pharm.D., BCPS []  Notre DameMinh Pham, VermontPharm.D., BCPS, AAHIVP []  Estella HuskMichelle Turner, Pharm.D., BCPS, AAHIVP  Positive urine culture Treated with Keflex, organism sensitive to the same and no further patient follow-up is required at this time.  Zeb ComfortHolland, Jemeka Wagler 09/11/2013, 5:23 PM

## 2015-11-08 DIAGNOSIS — N390 Urinary tract infection, site not specified: Secondary | ICD-10-CM

## 2015-11-08 HISTORY — DX: Urinary tract infection, site not specified: N39.0

## 2015-11-26 ENCOUNTER — Observation Stay (HOSPITAL_COMMUNITY)
Admission: EM | Admit: 2015-11-26 | Discharge: 2015-11-27 | Payer: Medicare Other | Attending: Internal Medicine | Admitting: Internal Medicine

## 2015-11-26 ENCOUNTER — Encounter (HOSPITAL_COMMUNITY): Payer: Self-pay

## 2015-11-26 ENCOUNTER — Emergency Department (HOSPITAL_COMMUNITY): Payer: Medicare Other

## 2015-11-26 DIAGNOSIS — E559 Vitamin D deficiency, unspecified: Secondary | ICD-10-CM | POA: Diagnosis not present

## 2015-11-26 DIAGNOSIS — R000 Tachycardia, unspecified: Secondary | ICD-10-CM | POA: Diagnosis not present

## 2015-11-26 DIAGNOSIS — E870 Hyperosmolality and hypernatremia: Secondary | ICD-10-CM | POA: Diagnosis not present

## 2015-11-26 DIAGNOSIS — Z87442 Personal history of urinary calculi: Secondary | ICD-10-CM | POA: Insufficient documentation

## 2015-11-26 DIAGNOSIS — R918 Other nonspecific abnormal finding of lung field: Secondary | ICD-10-CM | POA: Diagnosis not present

## 2015-11-26 DIAGNOSIS — G319 Degenerative disease of nervous system, unspecified: Secondary | ICD-10-CM | POA: Diagnosis not present

## 2015-11-26 DIAGNOSIS — M1990 Unspecified osteoarthritis, unspecified site: Secondary | ICD-10-CM | POA: Insufficient documentation

## 2015-11-26 DIAGNOSIS — Z66 Do not resuscitate: Secondary | ICD-10-CM | POA: Insufficient documentation

## 2015-11-26 DIAGNOSIS — F0390 Unspecified dementia without behavioral disturbance: Secondary | ICD-10-CM | POA: Insufficient documentation

## 2015-11-26 DIAGNOSIS — R4182 Altered mental status, unspecified: Secondary | ICD-10-CM | POA: Insufficient documentation

## 2015-11-26 DIAGNOSIS — N179 Acute kidney failure, unspecified: Secondary | ICD-10-CM | POA: Insufficient documentation

## 2015-11-26 DIAGNOSIS — E878 Other disorders of electrolyte and fluid balance, not elsewhere classified: Secondary | ICD-10-CM | POA: Insufficient documentation

## 2015-11-26 DIAGNOSIS — E785 Hyperlipidemia, unspecified: Secondary | ICD-10-CM | POA: Insufficient documentation

## 2015-11-26 DIAGNOSIS — E875 Hyperkalemia: Secondary | ICD-10-CM | POA: Diagnosis not present

## 2015-11-26 DIAGNOSIS — E872 Acidosis, unspecified: Secondary | ICD-10-CM | POA: Diagnosis present

## 2015-11-26 DIAGNOSIS — E860 Dehydration: Secondary | ICD-10-CM | POA: Insufficient documentation

## 2015-11-26 DIAGNOSIS — L8990 Pressure ulcer of unspecified site, unspecified stage: Secondary | ICD-10-CM | POA: Insufficient documentation

## 2015-11-26 DIAGNOSIS — I493 Ventricular premature depolarization: Secondary | ICD-10-CM | POA: Diagnosis not present

## 2015-11-26 DIAGNOSIS — N189 Chronic kidney disease, unspecified: Secondary | ICD-10-CM | POA: Insufficient documentation

## 2015-11-26 DIAGNOSIS — I10 Essential (primary) hypertension: Secondary | ICD-10-CM | POA: Diagnosis not present

## 2015-11-26 DIAGNOSIS — G9340 Encephalopathy, unspecified: Secondary | ICD-10-CM | POA: Diagnosis not present

## 2015-11-26 DIAGNOSIS — M47894 Other spondylosis, thoracic region: Secondary | ICD-10-CM | POA: Diagnosis not present

## 2015-11-26 DIAGNOSIS — Z515 Encounter for palliative care: Secondary | ICD-10-CM | POA: Insufficient documentation

## 2015-11-26 DIAGNOSIS — A419 Sepsis, unspecified organism: Principal | ICD-10-CM | POA: Diagnosis present

## 2015-11-26 DIAGNOSIS — I129 Hypertensive chronic kidney disease with stage 1 through stage 4 chronic kidney disease, or unspecified chronic kidney disease: Secondary | ICD-10-CM | POA: Diagnosis not present

## 2015-11-26 DIAGNOSIS — L909 Atrophic disorder of skin, unspecified: Secondary | ICD-10-CM

## 2015-11-26 DIAGNOSIS — N390 Urinary tract infection, site not specified: Secondary | ICD-10-CM | POA: Diagnosis present

## 2015-11-26 DIAGNOSIS — E039 Hypothyroidism, unspecified: Secondary | ICD-10-CM | POA: Diagnosis not present

## 2015-11-26 DIAGNOSIS — E538 Deficiency of other specified B group vitamins: Secondary | ICD-10-CM | POA: Insufficient documentation

## 2015-11-26 DIAGNOSIS — R238 Other skin changes: Secondary | ICD-10-CM | POA: Diagnosis present

## 2015-11-26 DIAGNOSIS — Z7189 Other specified counseling: Secondary | ICD-10-CM | POA: Insufficient documentation

## 2015-11-26 HISTORY — DX: Urinary tract infection, site not specified: N39.0

## 2015-11-26 LAB — COMPREHENSIVE METABOLIC PANEL
ALK PHOS: 214 U/L — AB (ref 38–126)
ALT: 44 U/L (ref 14–54)
AST: 111 U/L — ABNORMAL HIGH (ref 15–41)
Albumin: 2.7 g/dL — ABNORMAL LOW (ref 3.5–5.0)
Anion gap: 21 — ABNORMAL HIGH (ref 5–15)
BUN: 54 mg/dL — ABNORMAL HIGH (ref 6–20)
CALCIUM: 9.2 mg/dL (ref 8.9–10.3)
CHLORIDE: 121 mmol/L — AB (ref 101–111)
CO2: 17 mmol/L — ABNORMAL LOW (ref 22–32)
CREATININE: 3.07 mg/dL — AB (ref 0.44–1.00)
GFR, EST AFRICAN AMERICAN: 14 mL/min — AB (ref >60)
GFR, EST NON AFRICAN AMERICAN: 12 mL/min — AB (ref >60)
Glucose, Bld: 186 mg/dL — ABNORMAL HIGH (ref 65–99)
Potassium: 5.3 mmol/L — ABNORMAL HIGH (ref 3.5–5.1)
Sodium: 159 mmol/L — ABNORMAL HIGH (ref 135–145)
TOTAL PROTEIN: 7.3 g/dL (ref 6.5–8.1)
Total Bilirubin: 1.2 mg/dL (ref 0.3–1.2)

## 2015-11-26 LAB — CBC WITH DIFFERENTIAL/PLATELET
BASOS ABS: 0 10*3/uL (ref 0.0–0.1)
Basophils Relative: 0 %
EOS ABS: 0 10*3/uL (ref 0.0–0.7)
Eosinophils Relative: 0 %
HCT: 52.5 % — ABNORMAL HIGH (ref 36.0–46.0)
Hemoglobin: 16.3 g/dL — ABNORMAL HIGH (ref 12.0–15.0)
Lymphocytes Relative: 8 %
Lymphs Abs: 0.9 10*3/uL (ref 0.7–4.0)
MCH: 32.2 pg (ref 26.0–34.0)
MCHC: 31 g/dL (ref 30.0–36.0)
MCV: 103.8 fL — ABNORMAL HIGH (ref 78.0–100.0)
MONO ABS: 0.5 10*3/uL (ref 0.1–1.0)
Monocytes Relative: 4 %
NEUTROS PCT: 88 %
Neutro Abs: 10.3 10*3/uL — ABNORMAL HIGH (ref 1.7–7.7)
PLATELETS: 172 10*3/uL (ref 150–400)
RBC: 5.06 MIL/uL (ref 3.87–5.11)
RDW: 14.6 % (ref 11.5–15.5)
WBC: 11.7 10*3/uL — AB (ref 4.0–10.5)

## 2015-11-26 LAB — URINALYSIS, ROUTINE W REFLEX MICROSCOPIC
Bilirubin Urine: NEGATIVE
Glucose, UA: NEGATIVE mg/dL
Ketones, ur: NEGATIVE mg/dL
Nitrite: NEGATIVE
PROTEIN: 30 mg/dL — AB
Specific Gravity, Urine: 1.02 (ref 1.005–1.030)
pH: 5 (ref 5.0–8.0)

## 2015-11-26 LAB — VITAMIN B12: Vitamin B-12: 5119 pg/mL — ABNORMAL HIGH (ref 180–914)

## 2015-11-26 LAB — I-STAT CG4 LACTIC ACID, ED
LACTIC ACID, VENOUS: 6.54 mmol/L — AB (ref 0.5–2.0)
Lactic Acid, Venous: 8.46 mmol/L (ref 0.5–2.0)

## 2015-11-26 LAB — URINE MICROSCOPIC-ADD ON: Squamous Epithelial / LPF: NONE SEEN

## 2015-11-26 LAB — MRSA PCR SCREENING: MRSA BY PCR: NEGATIVE

## 2015-11-26 LAB — LACTIC ACID, PLASMA: Lactic Acid, Venous: 3.4 mmol/L (ref 0.5–2.0)

## 2015-11-26 LAB — TSH: TSH: 0.192 u[IU]/mL — ABNORMAL LOW (ref 0.350–4.500)

## 2015-11-26 MED ORDER — ACETAMINOPHEN 325 MG PO TABS: 650.0000 mg | ORAL_TABLET | Freq: Four times a day (QID) | ORAL | Status: DC | PRN

## 2015-11-26 MED ORDER — SODIUM CHLORIDE 0.45 % IV SOLN: INTRAVENOUS | Status: DC

## 2015-11-26 MED ORDER — PIPERACILLIN-TAZOBACTAM 3.375 G IVPB 30 MIN
3.3750 g | Freq: Once | INTRAVENOUS | Status: AC
  Administered 2015-11-26: 3.375 g via INTRAVENOUS
  Filled 2015-11-26: qty 50

## 2015-11-26 MED ORDER — ONDANSETRON HCL 4 MG PO TABS: 4.0000 mg | ORAL_TABLET | Freq: Four times a day (QID) | ORAL | Status: DC | PRN

## 2015-11-26 MED ORDER — ACETAMINOPHEN 650 MG RE SUPP: 650.0000 mg | Freq: Four times a day (QID) | RECTAL | Status: DC | PRN

## 2015-11-26 MED ORDER — FUROSEMIDE 10 MG/ML IJ SOLN: 20.0000 mg | Freq: Once | INTRAMUSCULAR | Status: DC

## 2015-11-26 MED ORDER — SODIUM CHLORIDE 0.9 % IV BOLUS (SEPSIS)
1000.0000 mL | INTRAVENOUS | Status: AC
  Administered 2015-11-26 (×2): 1000 mL via INTRAVENOUS

## 2015-11-26 MED ORDER — PIPERACILLIN-TAZOBACTAM IN DEX 2-0.25 GM/50ML IV SOLN
2.2500 g | Freq: Three times a day (TID) | INTRAVENOUS | Status: DC
  Administered 2015-11-26 – 2015-11-27 (×3): 2.25 g via INTRAVENOUS
  Filled 2015-11-26 (×5): qty 50

## 2015-11-26 MED ORDER — SODIUM CHLORIDE 0.9 % IV SOLN
INTRAVENOUS | Status: DC
  Administered 2015-11-26 – 2015-11-27 (×2): via INTRAVENOUS

## 2015-11-26 MED ORDER — ONDANSETRON HCL 4 MG/2ML IJ SOLN: 4.0000 mg | Freq: Four times a day (QID) | INTRAMUSCULAR | Status: DC | PRN

## 2015-11-26 MED ORDER — ENOXAPARIN SODIUM 30 MG/0.3ML ~~LOC~~ SOLN
30.0000 mg | SUBCUTANEOUS | Status: DC
  Administered 2015-11-26: 30 mg via SUBCUTANEOUS
  Filled 2015-11-26: qty 0.3

## 2015-11-26 MED ORDER — VANCOMYCIN HCL IN DEXTROSE 1-5 GM/200ML-% IV SOLN
1000.0000 mg | Freq: Once | INTRAVENOUS | Status: AC
  Administered 2015-11-26: 1000 mg via INTRAVENOUS
  Filled 2015-11-26: qty 200

## 2015-11-26 MED ORDER — SODIUM CHLORIDE 0.9 % IV SOLN: INTRAVENOUS | Status: DC

## 2015-11-26 MED ORDER — ACETAMINOPHEN 650 MG RE SUPP
650.0000 mg | Freq: Once | RECTAL | Status: AC
  Administered 2015-11-26: 650 mg via RECTAL
  Filled 2015-11-26: qty 1

## 2015-11-26 NOTE — H&P (Signed)
[facility] History and Physical  Council Anne Doucet UJW:[id number] DOB: 1923-05-06 DOA: 11/26/2015  Referring physician: Manus Gunningrancour PCP: Aida PufferLITTLE,JAMES, MD   Chief Complaint: ams  HPI: [name] is a 82 y.o. female with a past medical history that includes severe dementia, hypertension, hypothyroidism, vitamin D deficiency, chronic kidney disease presents to emergency department from nursing facility chief complaint of altered mental status. Initial evaluation in the emergency department reveals urinary tract infection dehydration sepsis.  Permission is obtained from the daughter who is at the bedside and the chart. Her indicates patient's been undergoing a steady decline over the last several weeks. She is less communicative and has stopped feeding herself. Chart indicates this morning when staff were getting her up they noticed she was less alert than usual. Her baseline is nonverbal but she typically will make eye contact and smile periodically. Her indicate she felt warm to the touch and was tachycardic she was transported to the emergency department. Daughter reports no recent illness no reports of nausea vomiting diarrhea. No indication of any chest pain or abdominal pain.  In the emergency department she is a temperature of 101.5 heart rate of 123 tachypnea with respiratory rate of 27 she is not hypoxic  Emergency department patient is provided with IV fluids and antibiotics.  Review of Systems:  10 point review of systems complete and all systems are negative except as indicated in the history of present illness  Past Medical History  Diagnosis Date  . Dementia   . Hypertension   . Arthritis   . Hypothyroidism   . Vitamin D deficiency   . Hyperlipemia   . Anemia     b12 def  . Chronic kidney disease     kidney stones   Past Surgical History  Procedure Laterality Date  . Fracture surgery      R Leg  . Rotator cuff repair      Left  . Lithotripsy    . Cataract extraction     . Femur im nail  01/23/2012    Procedure: INTRAMEDULLARY (IM) NAIL FEMORAL;  Surgeon: Javier DockerJeffrey C Beane, MD;  Location: [facility];  Service: Orthopedics;  Laterality: Left;   Social History:  reports that she has never smoked. She has never used smokeless tobacco. She reports that she does not drink alcohol or use illicit drugs.  No Known Allergies  No family history on file. Unable to obtain family medical history due to patient's dementia  Prior to Admission medications   Medication Sig Start Date End Date Taking? Authorizing Provider  cholecalciferol (VITAMIN D) 1000 units tablet Take 1,000 Units by mouth daily.   Yes Historical Provider, MD  cyanocobalamin (,VITAMIN B-12,) 1000 MCG/ML injection Inject 1,000 mcg into the muscle every Wednesday. On or about the 20th of each month   Yes Historical Provider, MD  levothyroxine (SYNTHROID, LEVOTHROID) 150 MCG tablet Take 150 mcg by mouth daily before breakfast.   Yes Historical Provider, MD  omeprazole (PRILOSEC) 20 MG capsule Take 20 mg by mouth daily.   Yes Historical Provider, MD  verapamil (CALAN) 120 MG tablet Take 240 mg by mouth daily.   Yes Historical Provider, MD  vitamin B-12 (CYANOCOBALAMIN) 500 MCG tablet Take 500 mcg by mouth daily.   Yes Historical Provider, MD   Physical Exam: Filed Vitals:   11/26/15 1245 11/26/15 1300 11/26/15 1315 11/26/15 1330  BP: 103/59 109/70 105/66 113/69  Pulse: 100 100 101 101  Temp:      TempSrc:  Resp: Height:      Weight:      SpO2: 91% 92% 93% 93%    Wt Readings from Last 3 Encounters:  11/26/15 49.896 kg (110 lb)  04/10/12 62.596 kg (138 lb)  01/23/12 68.675 kg (151 lb 6.4 oz)    General:  Appears Thin and frail chronically ill Eyes: PERRL, normal lids, irises & conjunctiva ENT: grossly normal hearing, lips & tongue is membranes of her mouth are slightly pale somewhat dry Neck: no LAD, masses or thyromegaly Cardiovascular: Cardiomegaly but regular, no m/r/g. No LE  edema. Lateral lower extremity contracted at knees and hips  Respiratory: Coarse breath sounds throughout, no w/r/r. Normal respiratory effort. Aspiration somewhat shallow Abdomen: soft,  positive bowel sounds Skin: Left arm above elbow with a skin tear left hip erythema at hip prominence Musculoskeletal: grossly normal tone BUE/BLE joints without swelling/erythema contracted lower extremities Psychiatric: grossly normal mood and affect, speech fluent and appropriate Neurologic: On verbal does not follow commands no spontaneous movement of extremities will make eye contact occasionally attempts to smile           Labs on Admission:  Basic Metabolic Panel:  Recent Labs Lab 11/26/15 1045  NA 159*  K 5.3*  CL 121*  CO2 17*  GLUCOSE 186*  BUN 54*  CREATININE 3.07*  CALCIUM 9.2   Liver Function Tests:  Recent Labs Lab 11/26/15 1045  AST 111*  ALT 44  ALKPHOS 214*  BILITOT 1.2  PROT 7.3  ALBUMIN 2.7*   No results for input(s): LIPASE, AMYLASE in the last 168 hours. No results for input(s): AMMONIA in the last 168 hours. CBC:  Recent Labs Lab 11/26/15 1045  WBC 11.7*  NEUTROABS 10.3*  HGB 16.3*  HCT 52.5*  MCV 103.8*  PLT 172   Cardiac Enzymes: No results for input(s): CKTOTAL, CKMB, CKMBINDEX, TROPONINI in the last 168 hours.  BNP (last 3 results) No results for input(s): BNP in the last 8760 hours.  ProBNP (last 3 results) No results for input(s): PROBNP in the last 8760 hours.  CBG: No results for input(s): GLUCAP in the last 168 hours.  Radiological Exams on Admission: Ct Head Wo Contrast  11/26/2015  CLINICAL DATA:  Altered mental status and sepsis. EXAM: CT HEAD WITHOUT CONTRAST TECHNIQUE: Contiguous axial images were obtained from the base of the skull through the vertex without contrast. COMPARISON:  09/07/2013 FINDINGS: Stable diffuse cerebral atrophy with low-density throughout the periventricular white matter. No evidence for acute hemorrhage,  mass lesion, midline shift, hydrocephalus or large infarct. Paranasal sinuses are clear. Fluid in the right mastoid air cells. No calvarial fracture. IMPRESSION: No acute intracranial abnormality. Stable atrophy and evidence for chronic small vessel ischemic changes. Electronically Signed   By: Richarda Overlie M.D.   On: 11/26/2015 11:26   Dg Chest Port 1 View  11/26/2015  CLINICAL DATA:  Pt is less responsive than usual at baseline. Pt has a fever, is going to be admitted for sepsis. Hx HTN EXAM: PORTABLE CHEST 1 VIEW COMPARISON:  01/23/2012 FINDINGS: Heart size is normal. There is mild prominence of interstitial markings. No focal consolidations or pleural effusions. No pulmonary edema. Thoracic spondylosis. Chronic changes in the left shoulder. Status post right shoulder arthroplasty. IMPRESSION: Mildly prominent interstitial markings. No evidence for acute  abnormality. Electronically Signed   By: Norva Pavlov M.D.   On: 11/26/2015 10:21    EKG: Independently reviewed. Sinus tachycardia Ventricular premature complex Abnormal R-wave progression,  Assessment/Plan Principal Problem:   Sepsis (HCC) Active Problems:   Hypothyroidism   HTN (hypertension)   Acute encephalopathy   UTI (lower urinary tract infection)   Acidosis   Hypernatremia   Hyperkalemia  #1. Sepsis likely related to urinary tract infection. Fever of 101.5, tachycardia with heart rate in 120s, rapid respiratory rate in high 20s, Lactic acid greater than 8 trending down somewhat after fluids, chest x-ray without acute abnormality, dehydrated acute kidney injury. -Admit to MedSurg -Continue vigorous IV fluids -Track lactic acid -Continue Zosyn -Urine culture -correct metobolic derangement -Palliative care consult as it appears that she's had a steady decline over the past year and now is not able to feed herself or move and essentially is total care.  #2. Urinary tract infection. Await culture. -Continue Zosyn as noted  above-  -Urine output  3. Acute kidney injury. Clearly related to dehydration. Creatinine 3.07 on admission. Chart review indicates baseline around 1.0. Note this is old data -IV fluids as noted above -Hold nephrotoxins -Monitor urine output  4. Acute encephalopathy. Likely related to above. CT of the head without acute abnormality, more alert since IV fluids. Her daughter says very close to baseline -See therapies as noted above -We'll check vitamin B-12 and folate  #5. Hypertension. Pressure trending down slightly from admission. Home medications include verapamil. -Monitor closely -Hold home medications  #6. Metabolic/lactic acidosis. Lactic acid 8.4 on presentation trending down to 6.5 after fluids. Related to above -Continue IV fluids -Track lactic acid -Recheck labs in the morning  7. Hyperkalemia hypernatremia. Related to above dehydration. Expect resolution with IV fluids -Recheck in the morning   Code Status: DNR DVT Prophylaxis: Lovenox Family Communication: daughter at bedside Disposition Plan: back to facility  Time spent: 48  [facility]  I have examined the patient, reviewed the chart and modified the above note which I agree with. We'll continue aggressive care for now but I feel strongly that she should be transitioned to comfort care due to her significant decline.  Akai Dollard,MD Pager # [email or web address] 11/26/2015, 3:00 PM

## 2015-11-26 NOTE — ED Provider Notes (Signed)
CSN: [id number]     Arrival date & time 11/26/15  4098 History   First MD Initiated Contact with Patient 11/26/15 0932     Chief Complaint  Patient presents with  . Altered Mental Status     (Consider location/radiation/quality/duration/timing/severity/associated sxs/prior Treatment) HPI Comments: level 5 caveat for altered mental status and unresponsive. Patient brought by EMS after being found less responsive this morning. Was apparently normal last night. She is nonverbal. She has a DO NOT RESUSCITATE in place. She is warm to the touch and tachycardic. No family is immediately available. No reported history of fever, nausea, vomiting, pain. Unclear what her usual baseline is.  The history is provided by the patient and the EMS personnel. The history is limited by the condition of the patient.    Past Medical History  Diagnosis Date  . Dementia   . Hypertension   . Arthritis   . Hypothyroidism   . Vitamin D deficiency   . Hyperlipemia   . Anemia     b12 def  . Chronic kidney disease     kidney stones   Past Surgical History  Procedure Laterality Date  . Fracture surgery      R Leg  . Rotator cuff repair      Left  . Lithotripsy    . Cataract extraction    . Femur im nail  01/23/2012    Procedure: INTRAMEDULLARY (IM) NAIL FEMORAL;  Surgeon: Javier Docker, MD;  Location: [facility];  Service: Orthopedics;  Laterality: Left;   No family history on file. Social History  Substance Use Topics  . Smoking status: Never Smoker   . Smokeless tobacco: Never Used  . Alcohol Use: No   OB History    No data available     Review of Systems  Unable to perform ROS: Mental status change      Allergies  Review of patient's allergies indicates no known allergies.  Home Medications   Prior to Admission medications   Medication Sig Start Date End Date Taking? Authorizing Provider  cholecalciferol (VITAMIN D) 1000 units tablet Take 1,000 Units by mouth daily.   Yes Historical  Provider, MD  cyanocobalamin (,VITAMIN B-12,) 1000 MCG/ML injection Inject 1,000 mcg into the muscle every Wednesday. On or about the 20th of each month   Yes Historical Provider, MD  levothyroxine (SYNTHROID, LEVOTHROID) 150 MCG tablet Take 150 mcg by mouth daily before breakfast.   Yes Historical Provider, MD  omeprazole (PRILOSEC) 20 MG capsule Take 20 mg by mouth daily.   Yes Historical Provider, MD  verapamil (CALAN) 120 MG tablet Take 240 mg by mouth daily.   Yes Historical Provider, MD  vitamin B-12 (CYANOCOBALAMIN) 500 MCG tablet Take 500 mcg by mouth daily.   Yes Historical Provider, MD   BP 114/58 mmHg  Pulse 29  Temp(Src) 101.5 F (38.6 C) (Rectal)  Resp 35  Ht  (1.575 m)  Wt 110 lb (49.896 kg)  BMI 20.11 kg/m2  SpO2 95% Physical Exam  Constitutional:  Frail, ill-appearing, dry mucous membranes  HENT:  Head: Normocephalic and atraumatic.  Eyes: Conjunctivae are normal. Pupils are equal, round, and reactive to light.  Dilated pupils to 5 mm bilaterally  Cardiovascular: Regular rhythm.   No murmur heard. Tachycardic at 120s  Pulmonary/Chest: No respiratory distress.  Tachypnea, no distress, clear lungs  Abdominal: Soft. There is no tenderness. There is no rebound and no guarding.  Musculoskeletal: Normal range of motion. She exhibits no edema or  tenderness.  Contracted LE Abrasion to L lateral elbow  Neurological:  Alert, nonverbal, moving all extremities but not following commands  Skin: Skin is warm.    ED Course  Procedures (including critical care time) Labs Review Labs Reviewed  COMPREHENSIVE METABOLIC PANEL - Abnormal; Notable for the following:    Sodium 159 (*)    Potassium 5.3 (*)    Chloride 121 (*)    CO2 17 (*)    Glucose, Bld 186 (*)    BUN 54 (*)    Creatinine, Ser 3.07 (*)    Albumin 2.7 (*)    AST 111 (*)    Alkaline Phosphatase 214 (*)    GFR calc non Af Amer 12 (*)    GFR calc Af Amer 14 (*)    Anion gap 21 (*)    All other  components within normal limits  CBC WITH DIFFERENTIAL/PLATELET - Abnormal; Notable for the following:    WBC 11.7 (*)    Hemoglobin 16.3 (*)    HCT 52.5 (*)    MCV 103.8 (*)    Neutro Abs 10.3 (*)    All other components within normal limits  I-STAT CG4 LACTIC ACID, ED - Abnormal; Notable for the following:    Lactic Acid, Venous 8.46 (*)    All other components within normal limits  CULTURE, BLOOD (ROUTINE X 2)  CULTURE, BLOOD (ROUTINE X 2)  URINE CULTURE  URINALYSIS, ROUTINE W REFLEX MICROSCOPIC (NOT AT [facility])    Imaging Review Ct Head Wo Contrast  11/26/2015  CLINICAL DATA:  Altered mental status and sepsis. EXAM: CT HEAD WITHOUT CONTRAST TECHNIQUE: Contiguous axial images were obtained from the base of the skull through the vertex without contrast. COMPARISON:  09/07/2013 FINDINGS: Stable diffuse cerebral atrophy with low-density throughout the periventricular white matter. No evidence for acute hemorrhage, mass lesion, midline shift, hydrocephalus or large infarct. Paranasal sinuses are clear. Fluid in the right mastoid air cells. No calvarial fracture. IMPRESSION: No acute intracranial abnormality. Stable atrophy and evidence for chronic small vessel ischemic changes. Electronically Signed   By: Richarda OverlieAdam  Henn M.D.   On: 11/26/2015 11:26   Dg Chest Port 1 View  11/26/2015  CLINICAL DATA:  Pt is less responsive than usual at baseline. Pt has a fever, is going to be admitted for sepsis. Hx HTN EXAM: PORTABLE CHEST 1 VIEW COMPARISON:  01/23/2012 FINDINGS: Heart size is normal. There is mild prominence of interstitial markings. No focal consolidations or pleural effusions. No pulmonary edema. Thoracic spondylosis. Chronic changes in the left shoulder. Status post right shoulder arthroplasty. IMPRESSION: Mildly prominent interstitial markings. No evidence for acute  abnormality. Electronically Signed   By: Norva PavlovElizabeth  Brown M.D.   On: 11/26/2015 10:21   I have personally reviewed and evaluated  these images and lab results as part of my medical decision-making.   EKG Interpretation   Date/Time:  Sunday November 26 2015 09:45:35 EDT Ventricular Rate:  122 PR Interval:  143 QRS Duration: 91 QT Interval:  327 QTC Calculation: 466 R Axis:   49 Text Interpretation:  Artifact Sinus tachycardia Ventricular premature  complex Abnormal R-wave progression, early transition Borderline T wave  abnormalities No significant change was found Confirmed by Manus GunningANCOUR  MD,  Braylea Brancato ([id number]) on 11/26/2015 10:08:33 AM      MDM   Final diagnoses:  None   Patient from nursing home with altered mental status and decreased responsiveness. She is tachycardic and febrile on arrival. Code sepsis activated.  Family confirms DO  NOT RESUSCITATE.  IV fluids, IV antibiotics after cultures obtained, CT head, chest x-ray and urinalysis.  Lactate elevated at 8. Sodium 159. Creatinine 3. CT head negative. Chest x-ray negative.  Blood pressure improving. Suspect sepsis with urinary source.  Dehydration with hypernatremia, AKI, and hyperkalemia. Family does not want aggressive measures.  They are agreeable to IVF and antibiotics. But no intubation, CPR.  Admission d/w NP BLack.   CRITICAL CARE Performed by: Glynn Octave Total critical care time: 45 minutes Critical care time was exclusive of separately billable procedures and treating other patients. Critical care was necessary to treat or prevent imminent or life-threatening deterioration. Critical care was time spent personally by me on the following activities: development of treatment plan with patient and/or surrogate as well as nursing, discussions with consultants, evaluation of patient's response to treatment, examination of patient, obtaining history from patient or surrogate, ordering and performing treatments and interventions, ordering and review of laboratory studies, ordering and review of radiographic studies, pulse oximetry and  re-evaluation of patient's condition.     Glynn Octave, MD 11/26/15 [phone]

## 2015-11-26 NOTE — ED Notes (Signed)
Attempted report 

## 2015-11-26 NOTE — ED Notes (Signed)
Pt to CT

## 2015-11-26 NOTE — ED Notes (Signed)
Pt arrives EMS from NH where pt found to be less responsive after waking and being lifted to chair today. Pt responds to voice with eye opening.

## 2015-11-26 NOTE — ED Notes (Signed)
Pt retruns from CT and more alert. Follows with eyes. Moving upper extremity.  Does not follow command but family states this ias norm.

## 2015-11-26 NOTE — Progress Notes (Signed)
Pt admitted to the unit at 1550. Pt mental status is disoriented x 4. Skin is intact except where otherwise charted. Full assessment charted in [facility]. Call bell within reach. Visitor guidelines reviewed w/ pt and/or family.

## 2015-11-26 NOTE — Progress Notes (Addendum)
Pharmacy Antibiotic Note  Council Mechanicellie Stuhr is a 82 y.o. female admitted on 11/26/2015 with sepsis.  Pharmacy has been consulted for vancomycin and Zosyn dosing.  Vancomycin 1g and Zosyn 3.375g IV x1 given in ED. Tm 101.5, Tachycardic   Plan: Continue with current orders for now doses.  Follow-up labs to schedule further dosing.   No data recorded.  No results for input(s): WBC, CREATININE, LATICACIDVEN, VANCOTROUGH, VANCOPEAK, VANCORANDOM, GENTTROUGH, GENTPEAK, GENTRANDOM, TOBRATROUGH, TOBRAPEAK, TOBRARND, AMIKACINPEAK, AMIKACINTROU, AMIKACIN in the last 168 hours.  CrCl cannot be calculated (Unknown ideal weight.).    No Known Allergies  Antimicrobials this admission: 3/19 Vancomycin >>  3/19 Zosyn >>   Dose adjustments this admission: none  Microbiology results: 3/19 BCx: 3/19 UCx:    Thank you for allowing pharmacy to be a part of this patient's care.  Link SnufferJessica Blessings Inglett, PharmD, BCPS Clinical Pharmacist [phone]  11/26/2015 9:47 AM    ADDENDUM:  ARF- SCr elevated at 3.07 (baseline ~0.8-1)/est CrCl <10, LA 8.46, WBC 11.7.   Plan: Will dose vancomycin on levels due to poor renal function. Will obtain level at 48 hours from initial dose.  Zosyn 2.25g IV q8h.   Link SnufferJessica Corean Yoshimura, PharmD, BCPS Clinical Pharmacist [phone] 11/26/2015, 11:24 AM

## 2015-11-27 ENCOUNTER — Encounter (HOSPITAL_COMMUNITY): Payer: Self-pay | Admitting: General Practice

## 2015-11-27 DIAGNOSIS — N179 Acute kidney failure, unspecified: Secondary | ICD-10-CM | POA: Diagnosis not present

## 2015-11-27 DIAGNOSIS — A4151 Sepsis due to Escherichia coli [E. coli]: Secondary | ICD-10-CM | POA: Diagnosis not present

## 2015-11-27 DIAGNOSIS — L8990 Pressure ulcer of unspecified site, unspecified stage: Secondary | ICD-10-CM | POA: Insufficient documentation

## 2015-11-27 DIAGNOSIS — Z515 Encounter for palliative care: Secondary | ICD-10-CM | POA: Insufficient documentation

## 2015-11-27 DIAGNOSIS — G9340 Encephalopathy, unspecified: Secondary | ICD-10-CM | POA: Diagnosis not present

## 2015-11-27 DIAGNOSIS — Z7189 Other specified counseling: Secondary | ICD-10-CM | POA: Insufficient documentation

## 2015-11-27 DIAGNOSIS — E872 Acidosis: Secondary | ICD-10-CM | POA: Diagnosis not present

## 2015-11-27 LAB — COMPREHENSIVE METABOLIC PANEL
ALBUMIN: 2 g/dL — AB (ref 3.5–5.0)
ALT: 54 U/L (ref 14–54)
AST: 123 U/L — ABNORMAL HIGH (ref 15–41)
Alkaline Phosphatase: 130 U/L — ABNORMAL HIGH (ref 38–126)
BILIRUBIN TOTAL: 2.2 mg/dL — AB (ref 0.3–1.2)
BUN: 46 mg/dL — AB (ref 6–20)
CALCIUM: 7.5 mg/dL — AB (ref 8.9–10.3)
CO2: 14 mmol/L — AB (ref 22–32)
Chloride: >130 mmol/L (ref 101–111)
Creatinine, Ser: 1.88 mg/dL — ABNORMAL HIGH (ref 0.44–1.00)
GFR calc Af Amer: 26 mL/min — ABNORMAL LOW (ref >60)
GFR calc non Af Amer: 22 mL/min — ABNORMAL LOW (ref >60)
GLUCOSE: 90 mg/dL (ref 65–99)
Potassium: 4.8 mmol/L (ref 3.5–5.1)
Sodium: 158 mmol/L — ABNORMAL HIGH (ref 135–145)
TOTAL PROTEIN: 5.2 g/dL — AB (ref 6.5–8.1)

## 2015-11-27 LAB — URINE CULTURE

## 2015-11-27 LAB — FOLATE RBC
FOLATE, HEMOLYSATE: 427.8 ng/mL
Folate, RBC: 806 ng/mL (ref >498)
HEMATOCRIT: 53.1 % — AB (ref 34.0–46.6)

## 2015-11-27 MED ORDER — LORAZEPAM 1 MG PO TABS: 1.0000 mg | ORAL_TABLET | ORAL | Status: DC | PRN

## 2015-11-27 MED ORDER — LORAZEPAM 2 MG/ML IJ SOLN: 1.0000 mg | INTRAMUSCULAR | Status: DC | PRN

## 2015-11-27 MED ORDER — MORPHINE SULFATE (CONCENTRATE) 10 MG/0.5ML PO SOLN: 5.0000 mg | ORAL | Status: DC | PRN

## 2015-11-27 MED ORDER — BIOTENE DRY MOUTH MT LIQD: 15.0000 mL | OROMUCOSAL | Status: DC | PRN

## 2015-11-27 MED ORDER — GLYCOPYRROLATE 1 MG PO TABS
1.0000 mg | ORAL_TABLET | ORAL | Status: DC | PRN
  Filled 2015-11-27: qty 1

## 2015-11-27 MED ORDER — MORPHINE SULFATE (PF) 2 MG/ML IV SOLN: 1.0000 mg | INTRAVENOUS | Status: DC | PRN

## 2015-11-27 MED ORDER — GLYCOPYRROLATE 0.2 MG/ML IJ SOLN
0.2000 mg | INTRAMUSCULAR | Status: DC | PRN
  Filled 2015-11-27: qty 1

## 2015-11-27 MED ORDER — HALOPERIDOL LACTATE 2 MG/ML PO CONC
0.5000 mg | ORAL | Status: DC | PRN
  Filled 2015-11-27: qty 0.3

## 2015-11-27 MED ORDER — MORPHINE SULFATE (CONCENTRATE) 10 MG/0.5ML PO SOLN: 5.0000 mg | ORAL | Status: AC | PRN

## 2015-11-27 MED ORDER — LORAZEPAM 2 MG/ML PO CONC: 1.0000 mg | ORAL | Status: DC | PRN

## 2015-11-27 MED ORDER — HALOPERIDOL 0.5 MG PO TABS
0.5000 mg | ORAL_TABLET | ORAL | Status: DC | PRN
  Filled 2015-11-27: qty 1

## 2015-11-27 MED ORDER — HALOPERIDOL LACTATE 5 MG/ML IJ SOLN: 0.5000 mg | INTRAMUSCULAR | Status: DC | PRN

## 2015-11-27 MED ORDER — POLYVINYL ALCOHOL 1.4 % OP SOLN
1.0000 [drp] | Freq: Four times a day (QID) | OPHTHALMIC | Status: DC | PRN
  Filled 2015-11-27: qty 15

## 2015-11-27 MED ORDER — LORAZEPAM 2 MG/ML PO CONC: 1.0000 mg | ORAL | Status: AC | PRN

## 2015-11-27 MED ORDER — GLYCOPYRROLATE 1 MG PO TABS: 1.0000 mg | ORAL_TABLET | Freq: Two times a day (BID) | ORAL | Status: AC

## 2015-11-27 NOTE — Progress Notes (Signed)
Patient returning to [facility] with Hospice. CSW to set up transport.  Osborne Cascoadia Mychael Soots LCSWA [phone]

## 2015-11-27 NOTE — Consult Note (Signed)
Consultation Note Date: 11/27/2015   Patient Name: Anne Gonzales  DOB: 16-Jun-1923  MRN: [id number]  Age / Sex: 82 y.o., female  PCP: Tamsen Roers, MD Referring Physician: Reyne Dumas, MD  Reason for Consultation: Establishing goals of care and Hospice Evaluation  82 yo female with dementia from [facility].  She has been bed bound for approximately 2 years.  She was able to speak "yes" or "no" until approximately 1 month ago when she just stopped speaking.  She stopped feeding herself 3 weeks ago and this past week stopped PO intake.  She was admitted 3/19 with sepsis, acute on chronic kidney failure and UTI.  This morning she is unresponsive.  Her daughter commented that she used to smile at everyone but now has lost her smile over the last few weeks.   Clinical Assessment/Narrative:  I met with her daughter Katharine Look who acknowledges that her mother has been slowly dying for some period of time.  She states she has a very poor quality of life.  Katharine Look comments that her mother originally started to decline with the death of her husband in 12/03/2007.  She is a very quiet person.  We discussed comfort vs curative measures and Katharine Look agreed with a comfort care/palliative path.  We discussed both residential hospice and home hospice services.  We investigated residential hospice services, but there were no rooms available.  Katharine Look does not want to prolong this hospitalization - Nor does she want to prolong her mother's suffering.  She has chosen to take her mother back to [facility] with [facility].  We anticipate she will die in the next several weeks.  We do not anticipate a return to the hospital.  Katharine Look plans to call her brother in [place] and update him on their mother's condition.   Contacts/Participants in Discussion: Katharine Look patient's daughter at her mother's bedside. Primary Decision Maker: Katharine Look.   SUMMARY OF  RECOMMENDATIONS  Code Status/Advance Care Planning: DNR    Other Directives:None  Symptom Management:   Robinul for excess secretions  Oxy IR for dyspnea  Ativan for agitation (patient is displaying no agitation at this point)  Palliative Prophylaxis:   Frequent Pain Assessment, Oral Care and Turn Reposition  Additional Recommendations (Limitations, Scope, Preferences):  Avoid Hospitalization and Full Comfort Care   Psycho-social/Spiritual:  Support System: Adequate  Prognosis: < 2 weeks  Discharge Planning: Return to [facility] with Home Hospice Services.   Chief Complaint/ Primary Diagnoses: Present on Admission:  . Acute encephalopathy . UTI (lower urinary tract infection) . Acidosis . Hypothyroidism . HTN (hypertension) . Hypernatremia . Hyperkalemia . Sepsis ([place]) . Skin breakdown  I have reviewed the medical record, interviewed the patient and family, and examined the patient. The following aspects are pertinent.  Past Medical History  Diagnosis Date  . Dementia   . Hypertension   . Arthritis   . Hypothyroidism   . Vitamin D deficiency   . Hyperlipemia   . Anemia     b12 def  . Chronic kidney disease     kidney stones  . UTI (urinary tract infection) 12-03-15   Social History   Social History  . Marital Status: Widowed    Spouse Name: N/A  . Number of Children: N/A  . Years of Education: N/A   Social History Main Topics  . Smoking status: Never Smoker   . Smokeless tobacco: Never Used  . Alcohol Use: No  . Drug Use: No  . Sexual Activity: No  Other Topics Concern  . None   Social History Narrative   History reviewed. No pertinent family history. Scheduled Meds:  Continuous Infusions:  PRN Meds:.acetaminophen **OR** acetaminophen, antiseptic oral rinse, [DISCONTINUED] glycopyrrolate **OR** glycopyrrolate **OR** [DISCONTINUED] glycopyrrolate, morphine injection, morphine CONCENTRATE **OR** morphine CONCENTRATE, ondansetron **OR**  ondansetron (ZOFRAN) IV, polyvinyl alcohol Medications Prior to Admission:  Prior to Admission medications   Medication Sig Start Date End Date Taking? Authorizing Provider  cholecalciferol (VITAMIN D) 1000 units tablet Take 1,000 Units by mouth daily.   Yes Historical Provider, MD  cyanocobalamin (,VITAMIN B-12,) 1000 MCG/ML injection Inject 1,000 mcg into the muscle every Wednesday. On or about the 20th of each month   Yes Historical Provider, MD  levothyroxine (SYNTHROID, LEVOTHROID) 150 MCG tablet Take 150 mcg by mouth daily before breakfast.   Yes Historical Provider, MD  omeprazole (PRILOSEC) 20 MG capsule Take 20 mg by mouth daily.   Yes Historical Provider, MD  verapamil (CALAN) 120 MG tablet Take 240 mg by mouth daily.   Yes Historical Provider, MD  vitamin B-12 (CYANOCOBALAMIN) 500 MCG tablet Take 500 mcg by mouth daily.   Yes Historical Provider, MD   No Known Allergies  Review of Systems:  Unable to obtain.  Patient is unresponsive.   Physical Exam Pale, chronically ill appearing, frail, elderly female, lying in bed sleeping.  Unresponsive. Resp:  Coarse breath sounds, gurgling CV:  S1, S2.   Heart sounds mostly overshadowed by rattling in the lungs Abd:  Soft, NT, Nd Extremities:  No edema.  Not moving.   Vital Signs: BP 125/53 mmHg  Pulse 45  Temp(Src) 98.2 F (36.8 C) (Oral)  Resp 22  Ht '5\' 4"'  (1.626 m)  Wt 46.6 kg (102 lb 11.8 oz)  BMI 17.63 kg/m2  SpO2 94%  SpO2: SpO2: 94 % O2 Device:SpO2: 94 % O2 Flow Rate: .   IO: Intake/output summary:   Intake/Output Summary (Last 24 hours) at 11/27/15 1411 Last data filed at 11/27/15 0540  Gross per 24 hour  Intake   3620 ml  Output    150 ml  Net   3470 ml    LBM:   Baseline Weight: Weight: 49.896 kg (110 lb) (estimated by family) Most recent weight: Weight: 46.6 kg (102 lb 11.8 oz)      Palliative Assessment/Data:  Flowsheet Rows        Most Recent Value   Intake Tab    Referral Department  Hospitalist    Unit at Time of Referral  Med/Surg Unit   Palliative Care Primary Diagnosis  Neurology   Date Notified  11/26/15   Palliative Care Type  New Palliative care   Reason for referral  End of Life Care Assistance, Clarify Goals of Care, [place]   Date of Admission  11/26/15   Date first seen by Palliative Care  11/27/15   # of days Palliative referral response time  1 Day(s)   # of days IP prior to Palliative referral  0   Clinical Assessment    Palliative Performance Scale Score  10%   Psychosocial & Spiritual Assessment    Social Work Plan of Care  Clarified patient/family wishes with healthcare team, Education on Hospice   Palliative Care Outcomes    Patient/Family meeting held?  Yes   Palliative Care Outcomes  Changed to focus on comfort      Additional Data Reviewed:  CBC:    Component Value Date/Time   WBC 11.7* 11/26/2015 1045   HGB  16.3* 11/26/2015 1045   HCT 52.5* 11/26/2015 1045   PLT 172 11/26/2015 1045   MCV 103.8* 11/26/2015 1045   NEUTROABS 10.3* 11/26/2015 1045   LYMPHSABS 0.9 11/26/2015 1045   MONOABS 0.5 11/26/2015 1045   EOSABS 0.0 11/26/2015 1045   BASOSABS 0.0 11/26/2015 1045   Comprehensive Metabolic Panel:    Component Value Date/Time   NA 158* 11/27/2015 0730   K 4.8 11/27/2015 0730   CL >130* 11/27/2015 0730   CO2 14* 11/27/2015 0730   BUN 46* 11/27/2015 0730   CREATININE 1.88* 11/27/2015 0730   GLUCOSE 90 11/27/2015 0730   CALCIUM 7.5* 11/27/2015 0730   AST 123* 11/27/2015 0730   ALT 54 11/27/2015 0730   ALKPHOS 130* 11/27/2015 0730   BILITOT 2.2* 11/27/2015 0730   PROT 5.2* 11/27/2015 0730   ALBUMIN 2.0* 11/27/2015 0730     Time In: 11:00  Time Out: 12:00 Time Total: 60 min Greater than 50%  of this time was spent counseling and coordinating care related to the above assessment and plan.  Signed by:  Imogene Burn, PA-C Palliative Medicine Pager: [phone]     11/27/2015, 2:11 PM  Please contact Palliative  Medicine Team phone at [phone] for questions and concerns.

## 2015-11-27 NOTE — Care Management Note (Signed)
Case Management Note  Patient Details  Name: Selen Smucker MRN: [id number] Date of Birth: 1923/05/28  Subjective/Objective:                 Patient admitted last night, placed in observation. Spoke with daughter at the bedside. Patient is from [facility], where she has been getting a high level of care. Palliative met with daughter today. Preference was made for [facility], although there are no beds at this time. Daughter would like to get her mother back to [place] today since [facility] will not be able to take her. CM verified preferred hospice provider with [facility] as [facility], daughter presented with choice and agreed to use [facility]. Referral called in at 1:50 for discharge today.    Action/Plan:  Referral to [facility] made, [facility] aware patient may DC today with home hospice. Expected Discharge Date:                  Expected Discharge Plan:  Assisted Living / Rest Home  In-House Referral:  Clinical Social Work  Discharge planning Services  CM Consult  Post Acute Care Choice:  Hospice Choice offered to:  Adult Children  DME Arranged:    DME Agency:     HH Arranged:    HH Agency:  [facility]  Status of Service:  In process, will continue to follow  Medicare Important Message Given:    Date Medicare IM Given:    Medicare IM give by:    Date Additional Medicare IM Given:    Additional Medicare Important Message give by:     If discussed at [place] of Stay Meetings, dates discussed:    Additional Comments:  Carles Collet, RN 11/27/2015, 1:54 PM

## 2015-11-27 NOTE — Progress Notes (Signed)
Patient will DC to: [facility] Anticipated DC date: 11/27/15 Family notified: Daughter Transport by: [facility]  CSW signing off.  Cristobal GoldmannNadia Jaceyon Strole, [place] Clinical Social Worker [phone]

## 2015-11-27 NOTE — Progress Notes (Signed)
Nsg Discharge Note  Admit Date:  11/26/2015 Discharge date: 11/27/2015   Council Mechanicellie Oien to be D/C'd Nursing Home per MD order.  AVS completed.  Copy for chart, and copy for patient signed, and dated. Patient/caregiver able to verbalize understanding.  Discharge Medication:   Medication List    STOP taking these medications        cholecalciferol 1000 units tablet  Commonly known as:  VITAMIN D     cyanocobalamin 1000 MCG/ML injection  Commonly known as:  (VITAMIN B-12)     levothyroxine 150 MCG tablet  Commonly known as:  SYNTHROID, LEVOTHROID     omeprazole 20 MG capsule  Commonly known as:  PRILOSEC     verapamil 120 MG tablet  Commonly known as:  CALAN     vitamin B-12 500 MCG tablet  Commonly known as:  CYANOCOBALAMIN      TAKE these medications        glycopyrrolate 1 MG tablet  Commonly known as:  ROBINUL  Take 1 tablet (1 mg total) by mouth 2 (two) times daily.     LORazepam 2 MG/ML concentrated solution  Commonly known as:  ATIVAN  Take 0.5 mLs (1 mg total) by mouth every 4 (four) hours as needed for anxiety.     morphine CONCENTRATE 10 MG/0.5ML Soln concentrated solution  Take 0.25 mLs (5 mg total) by mouth every 2 (two) hours as needed for moderate pain (or dyspnea).        Discharge Assessment: Filed Vitals:   11/27/15 0438 11/27/15 1514  BP: 125/53 122/50  Pulse: 45 89  Temp: 98.2 F (36.8 C) 100.5 F (38.1 C)  Resp: 22 28   Skin has 2 DTI on L hip.  IV catheter discontinued intact. Site without signs and symptoms of complications - no redness or edema noted at insertion site, patient denies c/o pain - only slight tenderness at site.  Dressing with slight pressure applied.  D/c Instructions-Education: Discharge instructions given to patient/family with verbalized understanding. D/c education completed with patient/family including follow up instructions, medication list, d/c activities limitations if indicated, with other d/c instructions as  indicated by MD - patient able to verbalize understanding, all questions fully answered. Patient instructed to return to ED, call 911, or call MD for any changes in condition.  Patient escorted via WC, and D/C home via private auto.  Nykeem Citro Consuella Loselaine, RN 11/27/2015 3:45 PM

## 2015-11-27 NOTE — Care Management Obs Status (Signed)
MEDICARE OBSERVATION STATUS NOTIFICATION   Patient Details  Name: Anne Gonzales MRN: [id number] Date of Birth: [birth year]   Medicare Observation Status Notification Given:  Yes    Lawerance SabalDebbie Xachary Hambly, RN 11/27/2015, 11:00 AM

## 2015-11-27 NOTE — Progress Notes (Signed)
CSW consulted regarding residential hospice placement. Patient's daughter is in agreement with plan and [facility]. CSW sent referrals.  Osborne Cascoadia Suad Autrey LCSWA [phone]

## 2015-11-27 NOTE — Progress Notes (Signed)
Nutrition Brief Note  Chart reviewed. Pt now transitioning to comfort care.  No further nutrition interventions warranted at this time.  Please re-consult as needed.   [name] A. Riyan Haile, RD, LDN, CDE Pager: [phone] After hours Pager: [phone]  

## 2015-11-27 NOTE — Progress Notes (Signed)
CRITICAL VALUE ALERT  Critical value received:  Chloride > 150   Date of notification:  11/27/15  Time of notification:  908  Critical value read back:Yes.    Nurse who received alert:  Chere Babson   MD notified (1st page):  Dr. Susie CassetteAbrol  Time of first page:  [phone] on floor and aware  MD notified (2nd page):  Time of second page:  Responding MD:  Dr. Susie CassetteAbrol  Time MD responded:  [phone]

## 2015-11-27 NOTE — Discharge Summary (Signed)
Physician Discharge Summary  Anne Gonzales MRN: [id number] DOB/AGE: Feb 11, 1923 82 y.o.  PCP: Tamsen Roers, MD   Admit date: 11/26/2015 Discharge date: 11/27/2015  Discharge Diagnoses:   Principal Problem:   Sepsis ([place]) Active Problems:   Hypothyroidism   HTN (hypertension)   Acute encephalopathy   UTI (lower urinary tract infection)   Acidosis   Hypernatremia   Hyperkalemia   Skin breakdown   Pressure ulcer    Follow-up recommendations Patient is discharging back to nursing home with hospice        Medication List    STOP taking these medications        cholecalciferol 1000 units tablet  Commonly known as:  VITAMIN D     cyanocobalamin 1000 MCG/ML injection  Commonly known as:  (VITAMIN B-12)     levothyroxine 150 MCG tablet  Commonly known as:  SYNTHROID, LEVOTHROID     omeprazole 20 MG capsule  Commonly known as:  PRILOSEC     verapamil 120 MG tablet  Commonly known as:  CALAN     vitamin B-12 500 MCG tablet  Commonly known as:  CYANOCOBALAMIN      TAKE these medications        glycopyrrolate 1 MG tablet  Commonly known as:  ROBINUL  Take 1 tablet (1 mg total) by mouth 2 (two) times daily.     LORazepam 2 MG/ML concentrated solution  Commonly known as:  ATIVAN  Take 0.5 mLs (1 mg total) by mouth every 4 (four) hours as needed for anxiety.     morphine CONCENTRATE 10 MG/0.5ML Soln concentrated solution  Take 0.25 mLs (5 mg total) by mouth every 2 (two) hours as needed for moderate pain (or dyspnea).         Discharge Condition: *Prognosis poor         Discharge Instructions    Diet - low sodium heart healthy    Complete by:  As directed      Increase activity slowly    Complete by:  As directed            No Known Allergies    Disposition: 01-Home or Self Care   Consults:  Palliative care     Significant Diagnostic Studies:  Ct Head Wo Contrast  11/26/2015  CLINICAL DATA:  Altered mental status and sepsis. EXAM: CT  HEAD WITHOUT CONTRAST TECHNIQUE: Contiguous axial images were obtained from the base of the skull through the vertex without contrast. COMPARISON:  09/07/2013 FINDINGS: Stable diffuse cerebral atrophy with low-density throughout the periventricular white matter. No evidence for acute hemorrhage, mass lesion, midline shift, hydrocephalus or large infarct. Paranasal sinuses are clear. Fluid in the right mastoid air cells. No calvarial fracture. IMPRESSION: No acute intracranial abnormality. Stable atrophy and evidence for chronic small vessel ischemic changes. Electronically Signed   By: Markus Daft M.D.   On: 11/26/2015 11:26   Dg Chest Port 1 View  11/26/2015  CLINICAL DATA:  Pt is less responsive than usual at baseline. Pt has a fever, is going to be admitted for sepsis. Hx HTN EXAM: PORTABLE CHEST 1 VIEW COMPARISON:  01/23/2012 FINDINGS: Heart size is normal. There is mild prominence of interstitial markings. No focal consolidations or pleural effusions. No pulmonary edema. Thoracic spondylosis. Chronic changes in the left shoulder. Status post right shoulder arthroplasty. IMPRESSION: Mildly prominent interstitial markings. No evidence for acute  abnormality. Electronically Signed   By: Nolon Nations M.D.   On: 11/26/2015 10:21  Filed Weights   11/26/15 0945 11/26/15 1554  Weight: 49.896 kg (110 lb) 46.6 kg (102 lb 11.8 oz)     Microbiology: Recent Results (from the past 240 hour(s))  MRSA PCR Screening     Status: None   Collection Time: 11/26/15  4:06 PM  Result Value Ref Range Status   MRSA by PCR NEGATIVE NEGATIVE Final    Comment:        The GeneXpert MRSA Assay (FDA approved for NASAL specimens only), is one component of a comprehensive MRSA colonization surveillance program. It is not intended to diagnose MRSA infection nor to guide or monitor treatment for MRSA infections.        Blood Culture    Component Value Date/Time   SDES URINE, CATHETERIZED 09/07/2013  1901   SPECREQUEST NONE 09/07/2013 1901   CULT  09/07/2013 1901    ESCHERICHIA COLI Performed at [place] 09/10/2013 FINAL 09/07/2013 1901      Labs: Results for orders placed or performed during the hospital encounter of 11/26/15 (from the past 48 hour(s))  I-Stat CG4 Lactic Acid, ED  (not at  [facility])     Status: Abnormal   Collection Time: 11/26/15 10:32 AM  Result Value Ref Range   Lactic Acid, Venous 8.46 (HH) 0.5 - 2.0 mmol/L   Comment NOTIFIED PHYSICIAN   Comprehensive metabolic panel     Status: Abnormal   Collection Time: 11/26/15 10:45 AM  Result Value Ref Range   Sodium 159 (H) 135 - 145 mmol/L   Potassium 5.3 (H) 3.5 - 5.1 mmol/L   Chloride 121 (H) 101 - 111 mmol/L   CO2 17 (L) 22 - 32 mmol/L   Glucose, Bld 186 (H) 65 - 99 mg/dL   BUN 54 (H) 6 - 20 mg/dL   Creatinine, Ser 3.07 (H) 0.44 - 1.00 mg/dL   Calcium 9.2 8.9 - 10.3 mg/dL   Total Protein 7.3 6.5 - 8.1 g/dL   Albumin 2.7 (L) 3.5 - 5.0 g/dL   AST 111 (H) 15 - 41 U/L   ALT 44 14 - 54 U/L   Alkaline Phosphatase 214 (H) 38 - 126 U/L   Total Bilirubin 1.2 0.3 - 1.2 mg/dL   GFR calc non Af Amer 12 (L) >60 mL/min   GFR calc Af Amer 14 (L) >60 mL/min    Comment: (NOTE) The eGFR has been calculated using the CKD EPI equation. This calculation has not been validated in all clinical situations. eGFR's persistently <60 mL/min signify possible Chronic Kidney Disease.    Anion gap 21 (H) 5 - 15  CBC WITH DIFFERENTIAL     Status: Abnormal   Collection Time: 11/26/15 10:45 AM  Result Value Ref Range   WBC 11.7 (H) 4.0 - 10.5 K/uL   RBC 5.06 3.87 - 5.11 MIL/uL   Hemoglobin 16.3 (H) 12.0 - 15.0 g/dL   HCT 52.5 (H) 36.0 - 46.0 %   MCV 103.8 (H) 78.0 - 100.0 fL   MCH 32.2 26.0 - 34.0 pg   MCHC 31.0 30.0 - 36.0 g/dL   RDW 14.6 11.5 - 15.5 %   Platelets 172 150 - 400 K/uL   Neutrophils Relative % 88 %   Lymphocytes Relative 8 %   Monocytes Relative 4 %   Eosinophils Relative 0 %   Basophils  Relative 0 %   Neutro Abs 10.3 (H) 1.7 - 7.7 K/uL   Lymphs Abs 0.9 0.7 - 4.0 K/uL   Monocytes Absolute  0.5 0.1 - 1.0 K/uL   Eosinophils Absolute 0.0 0.0 - 0.7 K/uL   Basophils Absolute 0.0 0.0 - 0.1 K/uL   Smear Review MORPHOLOGY UNREMARKABLE   Urinalysis, Routine w reflex microscopic (not at [facility])     Status: Abnormal   Collection Time: 11/26/15 12:00 PM  Result Value Ref Range   Color, Urine AMBER (A) YELLOW    Comment: BIOCHEMICALS MAY BE AFFECTED BY COLOR   APPearance TURBID (A) CLEAR   Specific Gravity, Urine 1.020 1.005 - 1.030   pH 5.0 5.0 - 8.0   Glucose, UA NEGATIVE NEGATIVE mg/dL   Hgb urine dipstick LARGE (A) NEGATIVE   Bilirubin Urine NEGATIVE NEGATIVE   Ketones, ur NEGATIVE NEGATIVE mg/dL   Protein, ur 30 (A) NEGATIVE mg/dL   Nitrite NEGATIVE NEGATIVE   Leukocytes, UA LARGE (A) NEGATIVE  Urine microscopic-add on     Status: Abnormal   Collection Time: 11/26/15 12:00 PM  Result Value Ref Range   Squamous Epithelial / LPF NONE SEEN NONE SEEN   WBC, UA 6-30 0 - 5 WBC/hpf   RBC / HPF 0-5 0 - 5 RBC/hpf   Bacteria, UA MANY (A) NONE SEEN  I-Stat CG4 Lactic Acid, ED  (not at  [facility])     Status: Abnormal   Collection Time: 11/26/15 12:55 PM  Result Value Ref Range   Lactic Acid, Venous 6.54 (HH) 0.5 - 2.0 mmol/L   Comment NOTIFIED PHYSICIAN   TSH     Status: Abnormal   Collection Time: 11/26/15  1:48 PM  Result Value Ref Range   TSH 0.192 (L) 0.350 - 4.500 uIU/mL  MRSA PCR Screening     Status: None   Collection Time: 11/26/15  4:06 PM  Result Value Ref Range   MRSA by PCR NEGATIVE NEGATIVE    Comment:        The GeneXpert MRSA Assay (FDA approved for NASAL specimens only), is one component of a comprehensive MRSA colonization surveillance program. It is not intended to diagnose MRSA infection nor to guide or monitor treatment for MRSA infections.   Vitamin B12     Status: Abnormal   Collection Time: 11/26/15  4:32 PM  Result Value Ref Range   Vitamin B-12  5119 (H) 180 - 914 pg/mL    Comment: (NOTE) This assay is not validated for testing neonatal or myeloproliferative syndrome specimens for Vitamin B12 levels.   Lactic acid, plasma     Status: Abnormal   Collection Time: 11/26/15  5:58 PM  Result Value Ref Range   Lactic Acid, Venous 3.4 (HH) 0.5 - 2.0 mmol/L    Comment: CRITICAL RESULT CALLED TO, READ BACK BY AND VERIFIED WITH: JONES,S RN 11/26/15 1842 WOOTEN,K   Comprehensive metabolic panel     Status: Abnormal   Collection Time: 11/27/15  7:30 AM  Result Value Ref Range   Sodium 158 (H) 135 - 145 mmol/L   Potassium 4.8 3.5 - 5.1 mmol/L    Comment: HEMOLYSIS AT THIS LEVEL MAY AFFECT RESULT   Chloride >130 (HH) 101 - 111 mmol/L    Comment: CRITICAL RESULT CALLED TO, READ BACK BY AND VERIFIED WITH: G.GLEASON,RN 11/27/15 0907 BY BSLADE    CO2 14 (L) 22 - 32 mmol/L   Glucose, Bld 90 65 - 99 mg/dL   BUN 46 (H) 6 - 20 mg/dL   Creatinine, Ser 1.88 (H) 0.44 - 1.00 mg/dL    Comment: DELTA CHECK NOTED   Calcium 7.5 (L) 8.9 - 10.3 mg/dL  Total Protein 5.2 (L) 6.5 - 8.1 g/dL   Albumin 2.0 (L) 3.5 - 5.0 g/dL   AST 123 (H) 15 - 41 U/L   ALT 54 14 - 54 U/L   Alkaline Phosphatase 130 (H) 38 - 126 U/L   Total Bilirubin 2.2 (H) 0.3 - 1.2 mg/dL   GFR calc non Af Amer 22 (L) >60 mL/min   GFR calc Af Amer 26 (L) >60 mL/min    Comment: (NOTE) The eGFR has been calculated using the CKD EPI equation. This calculation has not been validated in all clinical situations. eGFR's persistently <60 mL/min signify possible Chronic Kidney Disease.    Anion gap NOT CALCULATED 5 - 15     Lipid Panel  No results found for: CHOL, TRIG, HDL, CHOLHDL, VLDL, LDLCALC, LDLDIRECT   No results found for: HGBA1C   Lab Results  Component Value Date   CREATININE 1.88* 11/27/2015     HPI :*82 y.o. female with a past medical history that includes severe dementia, hypertension, hypothyroidism, vitamin D deficiency, chronic kidney disease presents to  emergency department from nursing facility chief complaint of altered mental status. Initial evaluation in the emergency department reveals urinary tract infection dehydration sepsis.  Permission is obtained from the daughter who is at the bedside and the chart. Her indicates patient's been undergoing a steady decline over the last several weeks. She is less communicative and has stopped feeding herself. Chart indicates this morning when staff were getting her up they noticed she was less alert than usual. Her baseline is nonverbal but she typically will make eye contact and smile periodically. Her indicate she felt warm to the touch and was tachycardic she was transported to the emergency department. Daughter reports no recent illness no reports of nausea vomiting diarrhea. No indication of any chest pain or abdominal pain.  In the emergency department she is a temperature of 101.5 heart rate of 123 tachypnea with respiratory rate of 27 she is not hypoxic  HOSPITAL COURSE 1. Sepsis likely related to urinary tract infection. Fever of 101.5, tachycardia with heart rate in 120s, rapid respiratory rate in high 20s, Lactic acid greater than 8 trending down somewhat after fluids, chest x-ray without acute abnormality, dehydrated acute kidney injury. Patient admitted to [facility] [place] Treated with vigorous IV fluids pending palliative care consultation Treated with Zosyn/vancomycin -Urine culture pending Due to several metabolic derangements, electrolyte abnormalities, sepsis patient was evaluated by palliative care. Patient has been taken off of antibiotics by palliative care, patient is being discharged to SNF with hospice services to follow at the nursing home   #2. Urinary tract infection. Await culture. Treated with Zosyn   3. Acute kidney injury. Clearly related to dehydration. Creatinine 3.07 on admission. Now 1.88. Improved after IV fluids  4. Acute encephalopathy. Likely related to above. CT of the  head without acute abnormality, more alert since IV fluids. Her daughter says very close to baseline -See therapies as noted above Vitamin B-12 normal  #5. Hypertension. Pressure trending down slightly from admission. Home medications include verapamil held.     #6. Metabolic/lactic acidosis. Lactic acid 8.4 on presentation trending down to 6.5 after fluids. Related to above    7. Hyperkalemia hypernatremia, hyperchloremia. Related to above dehydration. Expect resolution with IV fluids Check magnesium       Discharge Exam:  Blood pressure 125/53, pulse 45, temperature 98.2 F (36.8 C), temperature source Oral, resp. rate 22, height 5' 4" (1.626 m), weight 46.6 kg (102 lb 11.8 oz),  SpO2 94 %.   General: Appears Thin and frail chronically ill  Eyes: PERRL, normal lids, irises & conjunctiva  ENT: grossly normal hearing, lips & tongue is membranes of her mouth are slightly pale somewhat dry  Neck: no LAD, masses or thyromegaly  Cardiovascular: Cardiomegaly but regular, no m/r/g. No LE edema. Lateral lower extremity contracted at knees and hips   Respiratory: Coarse breath sounds throughout, no w/r/r. Normal respiratory effort. Aspiration somewhat shallow  Abdomen: soft, positive bowel sounds  Skin: Left arm above elbow with a skin tear left hip erythema at hip prominence  Musculoskeletal: grossly normal tone BUE/BLE joints without swelling/erythema contracted lower extremities    Follow-up Information    Follow up with LITTLE,JAMES, MD. Schedule an appointment as soon as possible for a visit in 3 days.   Specialty:  Family Medicine   Contact information:   [address] [place] [address] [phone]       Signed: Reyne Dumas 11/27/2015, 1:38 PM        Time spent >45 mins

## 2015-11-27 NOTE — Progress Notes (Deleted)
[facility] PROGRESS NOTE  Anne Gonzales WUJ:[id number] DOB: 1923/02/11 DOA: 11/26/2015 PCP: Aida PufferLITTLE,JAMES, MD  Length of stay:    Assessment/Plan: Principal Problem:   Sepsis (HCC) Active Problems:   Hypothyroidism   HTN (hypertension)   Acute encephalopathy   UTI (lower urinary tract infection)   Acidosis   Hypernatremia   Hyperkalemia   Skin breakdown   Pressure ulcer   Brief summary 82 y.o. female with a past medical history that includes severe dementia, hypertension, hypothyroidism, vitamin D deficiency, chronic kidney disease presents to emergency department from nursing facility chief complaint of altered mental status. Initial evaluation in the emergency department reveals urinary tract infection dehydration sepsis.  Permission is obtained from the daughter who is at the bedside and the chart. Her indicates patient's been undergoing a steady decline over the last several weeks. She is less communicative and has stopped feeding herself. Chart indicates this morning when staff were getting her up they noticed she was less alert than usual. Her baseline is nonverbal but she typically will make eye contact and smile periodically. Her indicate she felt warm to the touch and was tachycardic she was transported to the emergency department. Daughter reports no recent illness no reports of nausea vomiting diarrhea. No indication of any chest pain or abdominal pain.  In the emergency department she is a temperature of 101.5 heart rate of 123 tachypnea with respiratory rate of 27 she is not hypoxic. Patient found to have a UTI     Assessment and plan #1. Sepsis likely related to urinary tract infection. Fever of 101.5, tachycardia with heart rate in 120s, rapid respiratory rate in high 20s, Lactic acid greater than 8 trending down somewhat after fluids, chest x-ray without acute abnormality, dehydrated acute kidney injury. Continue MedSurg -Continue vigorous IV fluids pending  palliative care consultation -Continue Zosyn/vancomycin -Urine culture -correct metobolic derangement -Palliative care consult as it appears that she's had a steady decline over the past year and now is not able to feed herself or move and essentially is total care.  #2. Urinary tract infection. Await culture. -Continue Zosyn as noted above-  -Urine output  3. Acute kidney injury. Clearly related to dehydration. Creatinine 3.07 on admission. Now 1.88. Chart review indicates baseline around 1.0. Note this is old data -IV fluids as noted above -Hold nephrotoxins -Monitor urine output  4. Acute encephalopathy. Likely related to above. CT of the head without acute abnormality, more alert since IV fluids. Her daughter says very close to baseline -See therapies as noted above Vitamin B-12 normal  #5. Hypertension. Pressure trending down slightly from admission. Home medications include verapamil held. Prn fluid boluses as needed   #6. Metabolic/lactic acidosis. Lactic acid 8.4 on presentation trending down to 6.5 after fluids. Related to above -Continue IV fluids -Track lactic acid -Recheck labs in the morning  7. Hyperkalemia hypernatremia, hyperchloremia. Related to above dehydration. Expect resolution with IV fluids Check magnesium     DVT prophylaxsis SCDs  Code Status:      Code Status Orders     Palliative care consult    Start     Ordered     Family Communication: Discussed in detail with the patient, all imaging results, lab results explained to the patient   Disposition Plan:   Palliative care consultation pending, anticipate discharge tomorrow     Consultants:  None  Procedures:  None  Antibiotics: Anti-infectives    Start     Dose/Rate Route Frequency  Ordered Stop   11/26/15 1800  piperacillin-tazobactam (ZOSYN) IVPB 2.25 g  Status:  Discontinued     2.25 g 100 mL/hr over 30 Minutes Intravenous Every 8 hours 11/26/15 1126 11/27/15 1140    11/26/15 0945  piperacillin-tazobactam (ZOSYN) IVPB 3.375 g     3.375 g 100 mL/hr over 30 Minutes Intravenous  Once 11/26/15 0944 11/26/15 1113   11/26/15 0945  vancomycin (VANCOCIN) IVPB 1000 mg/200 mL premix     1,000 mg 200 mL/hr over 60 Minutes Intravenous  Once 11/26/15 0944 11/26/15 1137         HPI/Subjective: Patient noncommunicative hypotensive, confused  Objective: Filed Vitals:   11/26/15 1554 11/26/15 2100 11/27/15 0013 11/27/15 0438  BP: 67/39 108/59 101/53 125/53  Pulse: 94 92 95 45  Temp: 98.5 F (36.9 C) 98.9 F (37.2 C) 97.6 F (36.4 C) 98.2 F (36.8 C)  TempSrc: Oral Axillary Oral Oral  Resp: Height:  (1.626 m)     Weight: 46.6 kg (102 lb 11.8 oz)     SpO2: 90% 92% 90% 94%    Intake/Output Summary (Last 24 hours) at 11/27/15 1330 Last data filed at 11/27/15 0540  Gross per 24 hour  Intake   3620 ml  Output    150 ml  Net   3470 ml    Exam: Frail, ill-appearing, dry mucous membranes  HENT:  Head: Normocephalic and atraumatic.  Eyes: Conjunctivae are normal. Pupils are equal, round, and reactive to light.  Dilated pupils to 5 mm bilaterally  Cardiovascular: Regular rhythm.  No murmur heard. Tachycardic at 120s  Pulmonary/Chest: No respiratory distress.  Tachypnea, no distress, clear lungs  Abdominal: Soft. There is no tenderness. There is no rebound and no guarding.  Musculoskeletal: Normal range of motion. She exhibits no edema or tenderness.  Contracted LE Abrasion to L lateral elbow       Data Review   Micro Results Recent Results (from the past 240 hour(s))  MRSA PCR Screening     Status: None   Collection Time: 11/26/15  4:06 PM  Result Value Ref Range Status   MRSA by PCR NEGATIVE NEGATIVE Final    Comment:        The GeneXpert MRSA Assay (FDA approved for NASAL specimens only), is one component of a comprehensive MRSA colonization surveillance program. It is not intended to diagnose MRSA infection  nor to guide or monitor treatment for MRSA infections.     Radiology Reports Ct Head Wo Contrast  11/26/2015  CLINICAL DATA:  Altered mental status and sepsis. EXAM: CT HEAD WITHOUT CONTRAST TECHNIQUE: Contiguous axial images were obtained from the base of the skull through the vertex without contrast. COMPARISON:  09/07/2013 FINDINGS: Stable diffuse cerebral atrophy with low-density throughout the periventricular white matter. No evidence for acute hemorrhage, mass lesion, midline shift, hydrocephalus or large infarct. Paranasal sinuses are clear. Fluid in the right mastoid air cells. No calvarial fracture. IMPRESSION: No acute intracranial abnormality. Stable atrophy and evidence for chronic small vessel ischemic changes. Electronically Signed   By: Richarda Overlie M.D.   On: 11/26/2015 11:26   Dg Chest Port 1 View  11/26/2015  CLINICAL DATA:  Pt is less responsive than usual at baseline. Pt has a fever, is going to be admitted for sepsis. Hx HTN EXAM: PORTABLE CHEST 1 VIEW COMPARISON:  01/23/2012 FINDINGS: Heart size is normal. There is mild prominence of interstitial markings. No focal consolidations or pleural effusions. No pulmonary edema. Thoracic  spondylosis. Chronic changes in the left shoulder. Status post right shoulder arthroplasty. IMPRESSION: Mildly prominent interstitial markings. No evidence for acute  abnormality. Electronically Signed   By: Norva Pavlov M.D.   On: 11/26/2015 10:21     CBC  Recent Labs Lab 11/26/15 1045  WBC 11.7*  HGB 16.3*  HCT 52.5*  PLT 172  MCV 103.8*  MCH 32.2  MCHC 31.0  RDW 14.6  LYMPHSABS 0.9  MONOABS 0.5  EOSABS 0.0  BASOSABS 0.0    Chemistries   Recent Labs Lab 11/26/15 1045 11/27/15 0730  NA 159* 158*  K 5.3* 4.8  CL 121* >130*  CO2 17* 14*  GLUCOSE 186* 90  BUN 54* 46*  CREATININE 3.07* 1.88*  CALCIUM 9.2 7.5*  AST 111* 123*  ALT 44 54  ALKPHOS 214* 130*  BILITOT 1.2 2.2*    ------------------------------------------------------------------------------------------------------------------ estimated creatinine clearance is 14.6 mL/min (by C-G formula based on Cr of 1.88). ------------------------------------------------------------------------------------------------------------------ No results for input(s): HGBA1C in the last 72 hours. ------------------------------------------------------------------------------------------------------------------ No results for input(s): CHOL, HDL, LDLCALC, TRIG, CHOLHDL, LDLDIRECT in the last 72 hours. ------------------------------------------------------------------------------------------------------------------  Recent Labs  11/26/15 1348  TSH 0.192*   ------------------------------------------------------------------------------------------------------------------  Recent Labs  11/26/15 1632  VITAMINB12 5119*    Coagulation profile No results for input(s): INR, PROTIME in the last 168 hours.  No results for input(s): DDIMER in the last 72 hours.  Cardiac Enzymes No results for input(s): CKMB, TROPONINI, MYOGLOBIN in the last 168 hours.  Invalid input(s): CK ------------------------------------------------------------------------------------------------------------------ Invalid input(s): POCBNP   CBG: No results for input(s): GLUCAP in the last 168 hours.     Studies: Ct Head Wo Contrast  11/26/2015  CLINICAL DATA:  Altered mental status and sepsis. EXAM: CT HEAD WITHOUT CONTRAST TECHNIQUE: Contiguous axial images were obtained from the base of the skull through the vertex without contrast. COMPARISON:  09/07/2013 FINDINGS: Stable diffuse cerebral atrophy with low-density throughout the periventricular white matter. No evidence for acute hemorrhage, mass lesion, midline shift, hydrocephalus or large infarct. Paranasal sinuses are clear. Fluid in the right mastoid air cells. No calvarial fracture.  IMPRESSION: No acute intracranial abnormality. Stable atrophy and evidence for chronic small vessel ischemic changes. Electronically Signed   By: Richarda Overlie M.D.   On: 11/26/2015 11:26   Dg Chest Port 1 View  11/26/2015  CLINICAL DATA:  Pt is less responsive than usual at baseline. Pt has a fever, is going to be admitted for sepsis. Hx HTN EXAM: PORTABLE CHEST 1 VIEW COMPARISON:  01/23/2012 FINDINGS: Heart size is normal. There is mild prominence of interstitial markings. No focal consolidations or pleural effusions. No pulmonary edema. Thoracic spondylosis. Chronic changes in the left shoulder. Status post right shoulder arthroplasty. IMPRESSION: Mildly prominent interstitial markings. No evidence for acute  abnormality. Electronically Signed   By: Norva Pavlov M.D.   On: 11/26/2015 10:21      No results found for: HGBA1C Lab Results  Component Value Date   CREATININE 1.88* 11/27/2015       Scheduled Meds:  Continuous Infusions:   Principal Problem:   Sepsis (HCC) Active Problems:   Hypothyroidism   HTN (hypertension)   Acute encephalopathy   UTI (lower urinary tract infection)   Acidosis   Hypernatremia   Hyperkalemia   Skin breakdown   Pressure ulcer    Time spent: 45 minutes   [facility]  [facility] Pager [phone]. If 7PM-7AM, please contact night-coverage at [email or web address], password [facility] 11/27/2015, 1:30 PM

## 2015-12-01 LAB — CULTURE, BLOOD (ROUTINE X 2)
CULTURE: NO GROWTH
Culture: NO GROWTH

## 2015-12-09 DEATH — deceased

## 2017-06-15 IMAGING — CR DG CHEST 1V PORT
1 series · 1 of 1 positions shown · non-contrast
Comparison: 01/23/2012

CLINICAL DATA: Pt is less responsive than usual at baseline. Pt has
a fever, is going to be admitted for sepsis. Hx HTN

EXAM:
PORTABLE CHEST 1 VIEW

[AP]
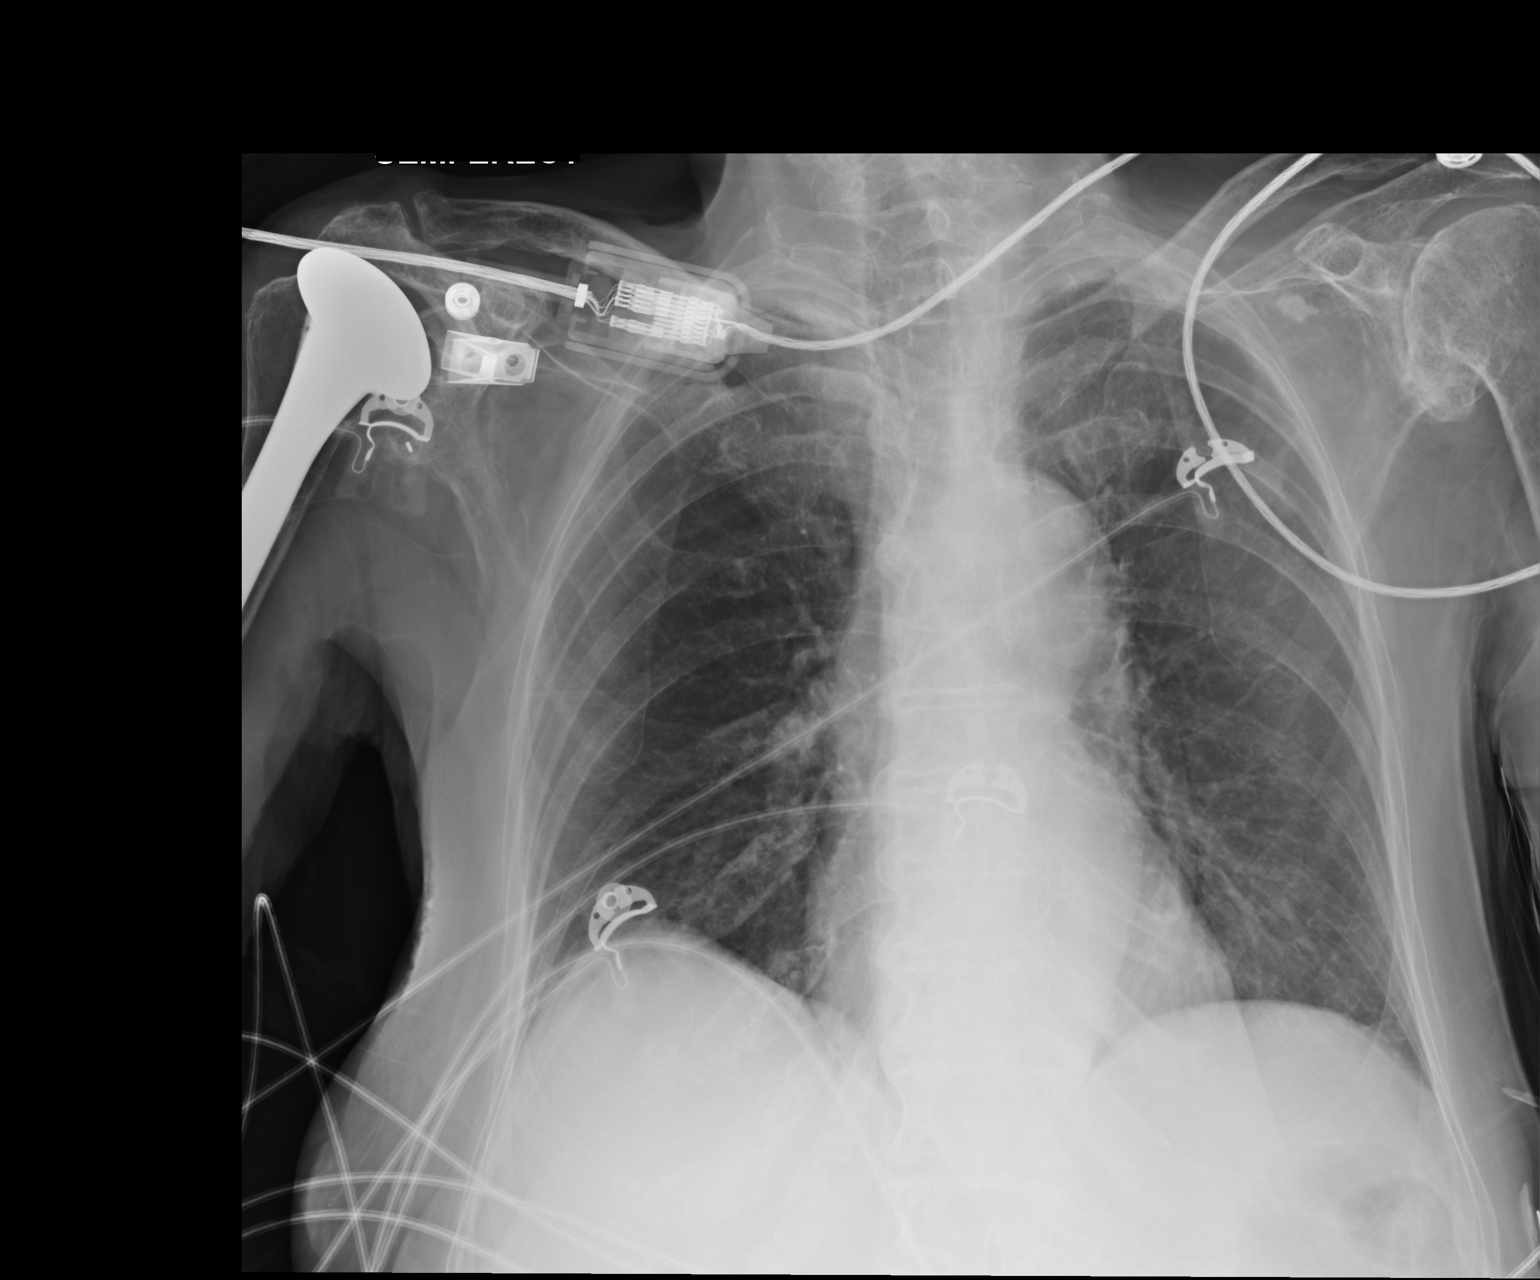

[1 of 1 positions shown; findings below may reference images not displayed]

FINDINGS: Heart size is normal. There is mild prominence of interstitial
markings. No focal consolidations or pleural effusions. No pulmonary
edema. Thoracic spondylosis. Chronic changes in the left shoulder.
Status post right shoulder arthroplasty.
IMPRESSION: Mildly prominent interstitial markings.

No evidence for acute  abnormality.
